# Patient Record
Sex: Female | Born: 1965 | Race: Black or African American | Hispanic: No | Marital: Single | Smoking: Never smoker
Health system: Southern US, Community
[De-identification: ages and names within clinical notes are randomized; demographics above are authoritative.]

## PROBLEM LIST (undated history)

## (undated) DIAGNOSIS — F32A Depression, unspecified: Secondary | ICD-10-CM

## (undated) DIAGNOSIS — Z9989 Dependence on other enabling machines and devices: Secondary | ICD-10-CM

## (undated) DIAGNOSIS — F419 Anxiety disorder, unspecified: Secondary | ICD-10-CM

## (undated) DIAGNOSIS — D649 Anemia, unspecified: Secondary | ICD-10-CM

## (undated) DIAGNOSIS — I10 Essential (primary) hypertension: Secondary | ICD-10-CM

## (undated) DIAGNOSIS — E785 Hyperlipidemia, unspecified: Secondary | ICD-10-CM

## (undated) DIAGNOSIS — M1990 Unspecified osteoarthritis, unspecified site: Secondary | ICD-10-CM

## (undated) DIAGNOSIS — T7840XA Allergy, unspecified, initial encounter: Secondary | ICD-10-CM

## (undated) DIAGNOSIS — F4310 Post-traumatic stress disorder, unspecified: Secondary | ICD-10-CM

## (undated) DIAGNOSIS — O24419 Gestational diabetes mellitus in pregnancy, unspecified control: Secondary | ICD-10-CM

## (undated) DIAGNOSIS — R51 Headache: Secondary | ICD-10-CM

## (undated) DIAGNOSIS — R7303 Prediabetes: Secondary | ICD-10-CM

## (undated) DIAGNOSIS — J45909 Unspecified asthma, uncomplicated: Secondary | ICD-10-CM

## (undated) DIAGNOSIS — L853 Xerosis cutis: Secondary | ICD-10-CM

## (undated) DIAGNOSIS — Z87442 Personal history of urinary calculi: Secondary | ICD-10-CM

## (undated) DIAGNOSIS — G4730 Sleep apnea, unspecified: Secondary | ICD-10-CM

## (undated) DIAGNOSIS — J302 Other seasonal allergic rhinitis: Secondary | ICD-10-CM

## (undated) HISTORY — PX: WISDOM TOOTH EXTRACTION: SHX21

## (undated) HISTORY — DX: Allergy, unspecified, initial encounter: T78.40XA

## (undated) HISTORY — PX: OTHER SURGICAL HISTORY: SHX169

## (undated) HISTORY — DX: Depression, unspecified: F32.A

## (undated) HISTORY — PX: MYOMECTOMY ABDOMINAL APPROACH: SUR870

## (undated) HISTORY — PX: COLONOSCOPY: SHX174

---

## 1998-09-20 ENCOUNTER — Other Ambulatory Visit: Admission: RE | Admit: 1998-09-20 | Discharge: 1998-09-20 | Payer: Self-pay | Admitting: Family Medicine

## 1998-10-11 ENCOUNTER — Emergency Department (HOSPITAL_COMMUNITY): Admission: EM | Admit: 1998-10-11 | Discharge: 1998-10-11 | Payer: Self-pay | Admitting: Emergency Medicine

## 2001-04-28 ENCOUNTER — Other Ambulatory Visit: Admission: RE | Admit: 2001-04-28 | Discharge: 2001-04-28 | Payer: Self-pay | Admitting: Obstetrics and Gynecology

## 2001-11-20 ENCOUNTER — Encounter: Payer: Self-pay | Admitting: Obstetrics and Gynecology

## 2001-11-20 ENCOUNTER — Ambulatory Visit (HOSPITAL_COMMUNITY): Admission: RE | Admit: 2001-11-20 | Discharge: 2001-11-20 | Payer: Self-pay | Admitting: Obstetrics and Gynecology

## 2002-01-08 ENCOUNTER — Inpatient Hospital Stay (HOSPITAL_COMMUNITY): Admission: RE | Admit: 2002-01-08 | Discharge: 2002-01-10 | Payer: Self-pay | Admitting: Obstetrics and Gynecology

## 2002-04-09 ENCOUNTER — Other Ambulatory Visit: Admission: RE | Admit: 2002-04-09 | Discharge: 2002-04-09 | Payer: Self-pay | Admitting: Obstetrics and Gynecology

## 2002-12-07 ENCOUNTER — Other Ambulatory Visit: Admission: RE | Admit: 2002-12-07 | Discharge: 2002-12-07 | Payer: Self-pay | Admitting: Obstetrics and Gynecology

## 2003-12-13 ENCOUNTER — Other Ambulatory Visit: Admission: RE | Admit: 2003-12-13 | Discharge: 2003-12-13 | Payer: Self-pay | Admitting: Obstetrics and Gynecology

## 2004-07-18 ENCOUNTER — Other Ambulatory Visit: Admission: RE | Admit: 2004-07-18 | Discharge: 2004-07-18 | Payer: Self-pay | Admitting: Obstetrics and Gynecology

## 2011-08-17 ENCOUNTER — Other Ambulatory Visit: Payer: Self-pay | Admitting: Obstetrics and Gynecology

## 2011-08-17 DIAGNOSIS — R928 Other abnormal and inconclusive findings on diagnostic imaging of breast: Secondary | ICD-10-CM

## 2011-08-29 ENCOUNTER — Ambulatory Visit
Admission: RE | Admit: 2011-08-29 | Discharge: 2011-08-29 | Disposition: A | Discharge status: Home / Self Care | Payer: No Typology Code available for payment source | Source: Ambulatory Visit | Attending: Obstetrics and Gynecology | Admitting: Obstetrics and Gynecology

## 2011-08-29 DIAGNOSIS — R928 Other abnormal and inconclusive findings on diagnostic imaging of breast: Secondary | ICD-10-CM

## 2011-09-03 ENCOUNTER — Ambulatory Visit
Admission: RE | Admit: 2011-09-03 | Discharge: 2011-09-03 | Disposition: A | Discharge status: Home / Self Care | Payer: No Typology Code available for payment source | Source: Ambulatory Visit | Attending: Obstetrics and Gynecology | Admitting: Obstetrics and Gynecology

## 2011-09-03 DIAGNOSIS — R928 Other abnormal and inconclusive findings on diagnostic imaging of breast: Secondary | ICD-10-CM

## 2013-01-19 ENCOUNTER — Encounter (HOSPITAL_COMMUNITY): Payer: Self-pay | Admitting: Pharmacist

## 2013-02-02 ENCOUNTER — Encounter (HOSPITAL_COMMUNITY): Payer: Self-pay

## 2013-02-02 ENCOUNTER — Encounter (HOSPITAL_COMMUNITY)
Admission: RE | Admit: 2013-02-02 | Discharge: 2013-02-02 | Disposition: A | Discharge status: Home / Self Care | Payer: No Typology Code available for payment source | Source: Ambulatory Visit | Attending: Obstetrics and Gynecology | Admitting: Obstetrics and Gynecology

## 2013-02-02 DIAGNOSIS — Z01812 Encounter for preprocedural laboratory examination: Secondary | ICD-10-CM | POA: Insufficient documentation

## 2013-02-02 HISTORY — DX: Hyperlipidemia, unspecified: E78.5

## 2013-02-02 HISTORY — DX: Anxiety disorder, unspecified: F41.9

## 2013-02-02 HISTORY — DX: Headache: R51

## 2013-02-02 HISTORY — DX: Xerosis cutis: L85.3

## 2013-02-02 HISTORY — DX: Unspecified osteoarthritis, unspecified site: M19.90

## 2013-02-02 HISTORY — DX: Other seasonal allergic rhinitis: J30.2

## 2013-02-02 HISTORY — DX: Anemia, unspecified: D64.9

## 2013-02-02 HISTORY — DX: Sleep apnea, unspecified: G47.30

## 2013-02-02 HISTORY — DX: Personal history of urinary calculi: Z87.442

## 2013-02-02 LAB — CBC
MCV: 85.3 fL (ref 78.0–100.0)
Platelets: 284 10*3/uL (ref 150–400)
RBC: 4.56 MIL/uL (ref 3.87–5.11)
WBC: 5.6 10*3/uL (ref 4.0–10.5)

## 2013-02-02 NOTE — Patient Instructions (Addendum)
   Your procedure is scheduled on:  Monday, 11/17  Enter through the Main Entrance of [facility] at: 6 AM Pick up the phone at the desk and dial [phone] and inform us of your arrival.  Please call this number if you have any problems the morning of surgery: [phone]  Remember: Do not eat or drink after midnight: Sunday Take these medicines the morning of surgery with a SIP OF WATER:  Lipitor, zyrtec  Do not wear jewelry, make-up, or FINGER nail polish No metal in your hair or on your body. Do not wear lotions, powders, perfumes. You may wear deodorant.  Please use your CHG wash as directed prior to surgery.  Do not shave anywhere for at least 12 hours prior to first CHG shower.  Do not bring valuables to the hospital. Contacts, dentures or bridgework may not be worn into surgery.  Leave suitcase in the car. After Surgery it may be brought to your room. For patients being admitted to the hospital, checkout time is 11:00am the day of discharge.  Home with daughter Elberta Fortis  cell [phone] or cousin Jamika Sadek cell [phone].

## 2013-02-04 NOTE — H&P (Signed)
  Patient name  Kristen Heath, Kristen Heath DICTATION#  [id number] CSN# [id number]  Juluis Mire, MD 02/04/2013 2:00 PM

## 2013-02-06 NOTE — H&P (Signed)
NAMEMarland Heath  TAM, SAVOIA NO.:  [id number]  MEDICAL RECORD NO.:  [id number]  LOCATION:  PERIO                         FACILITY:  [facility]  PHYSICIAN:  Juluis Mire, M.D.   DATE OF BIRTH:  [birth year]  DATE OF ADMISSION:  11/18/2012 DATE OF DISCHARGE:                             HISTORY & PHYSICAL   The date of her surgery is January 30, 2013, at [facility] here in [place].  HISTORY OF PRESENT ILLNESS:  The patient is a 47 year old, gravida 3, para 1, single female presents for LAVH and removal of both fallopian tubes.  She has had a previous myomectomy in 2003.  She was having problems with increasing pelvic pain and abnormal uterine bleeding felt to be secondary to adenomyosis and/or endometriosis.  This has been unresponsive to conservative management.  The patient now presents for definitive surgery perform with laparoscopic-assisted vaginal hysterectomy.  She describes her cycles the first day is being extremely heavy changing pads every 35-40 minutes with clots and worsening pelvic pain and discomfort.  Her hemoglobin has been depressed at 10.1.  Saline infusion and ultrasound basically consistent with adenomyosis.  ALLERGIES:  No known drug allergies.  MEDICATIONS:  She is on Mobic 7.5 mg, atorvastatin, calcium 40 mg a day, Celexa 10 mg a day, prenatal vitamins, sumatriptan 50 mg and iron sulfate supplementation.  PAST MEDICAL HISTORY:  She has had a history of glucose intolerance, arthritis and has had previous anemias and hypertension.  PAST SURGICAL HISTORY:  She had knee surgery in 1999, in 1993 she had a C-section.  She had a myomectomy in 2003.  SOCIAL HISTORY:  No drug, no cigarette, no tobacco or alcohol use.  FAMILY HISTORY:  She had a half sister with a history of breast cancer.  REVIEW OF SYSTEMS:  Noncontributory.  PHYSICAL EXAMINATION:  VITAL SIGNS:  The patient is afebrile with stable vital signs. HEENT:  The patient was  normocephalic. Pupils were equal, round and reactive to light and accommodation.  Extraocular movements were intact. Sclerae and conjunctivae were clear.  Oropharynx clear. NECK:  Without thyromegaly. BREASTS:  No discrete masses. LUNGS:  Clear. CARDIOVASCULAR SYSTEM:  Regular rhythm and rate.  No murmurs or gallops. No carotid or abdominal bruits. ABDOMEN:  Benign.  Well-healed low-transverse incision. PELVIC:  Normal external genitalia.  Vaginal mucosa is clear.  Cervix unremarkable.  Uterus feels to be of normal shape and size.  Adnexa unremarkable. EXTREMITIES:  Trace edema. NEUROLOGIC:  Grossly within normal limits.  IMPRESSION: 1. Menorrhagia and pelvic pain secondary to adenomyosis. 2. Previous myomectomy. 3. Hyperlipidemia.  PLAN:  The patient will undergo attempted laparoscopic-assisted vaginal hysterectomy with removal of both fallopian tubes.  The potential risks of surgery have been discussed including the risk of infection.  The risk of hemorrhage could require transfusion with the risk of AIDS or hepatitis.  Risk of injury to adjacent organs including bladder, bowel, ureters that could require further exploratory surgery.  Risk of deep venous thrombosis and pulmonary embolus.  Leaving the ovaries does have the potential for malignant transformation.  This may be because somewhat by removing the fallopian tubes.  Alternatives have been discussed including Mirena IUD or ablated technique.  She is in favor  of definitive therapy and informed hysterectomy for which she is admitted at the present time.     Juluis Mire, M.D.     JSM/MEDQ  D:  02/04/2013  T:  02/05/2013  Job:  [id number]

## 2013-02-09 ENCOUNTER — Inpatient Hospital Stay (HOSPITAL_COMMUNITY)
Admission: RE | Admit: 2013-02-09 | Discharge: 2013-02-11 | DRG: 743 | Disposition: A | Discharge status: Home / Self Care | Payer: No Typology Code available for payment source | Source: Ambulatory Visit | Attending: Obstetrics and Gynecology | Admitting: Obstetrics and Gynecology

## 2013-02-09 ENCOUNTER — Encounter (HOSPITAL_COMMUNITY)
Admission: RE | Disposition: A | Discharge status: Home / Self Care | Payer: Self-pay | Source: Ambulatory Visit | Attending: Obstetrics and Gynecology

## 2013-02-09 ENCOUNTER — Ambulatory Visit (HOSPITAL_COMMUNITY): Payer: No Typology Code available for payment source | Admitting: Anesthesiology

## 2013-02-09 ENCOUNTER — Encounter (HOSPITAL_COMMUNITY): Payer: No Typology Code available for payment source | Admitting: Anesthesiology

## 2013-02-09 ENCOUNTER — Encounter (HOSPITAL_COMMUNITY): Payer: Self-pay | Admitting: *Deleted

## 2013-02-09 DIAGNOSIS — N736 Female pelvic peritoneal adhesions (postinfective): Secondary | ICD-10-CM | POA: Diagnosis present

## 2013-02-09 DIAGNOSIS — N8000 Endometriosis of the uterus, unspecified: Secondary | ICD-10-CM | POA: Diagnosis present

## 2013-02-09 DIAGNOSIS — N938 Other specified abnormal uterine and vaginal bleeding: Secondary | ICD-10-CM | POA: Diagnosis present

## 2013-02-09 DIAGNOSIS — N939 Abnormal uterine and vaginal bleeding, unspecified: Secondary | ICD-10-CM | POA: Diagnosis present

## 2013-02-09 DIAGNOSIS — N920 Excessive and frequent menstruation with regular cycle: Secondary | ICD-10-CM | POA: Diagnosis present

## 2013-02-09 DIAGNOSIS — N949 Unspecified condition associated with female genital organs and menstrual cycle: Principal | ICD-10-CM | POA: Diagnosis present

## 2013-02-09 DIAGNOSIS — Z90710 Acquired absence of both cervix and uterus: Secondary | ICD-10-CM | POA: Clinically undetermined

## 2013-02-09 DIAGNOSIS — Z5331 Laparoscopic surgical procedure converted to open procedure: Secondary | ICD-10-CM

## 2013-02-09 DIAGNOSIS — R102 Pelvic and perineal pain unspecified side: Secondary | ICD-10-CM | POA: Diagnosis present

## 2013-02-09 DIAGNOSIS — E785 Hyperlipidemia, unspecified: Secondary | ICD-10-CM | POA: Diagnosis present

## 2013-02-09 DIAGNOSIS — D251 Intramural leiomyoma of uterus: Secondary | ICD-10-CM | POA: Diagnosis present

## 2013-02-09 DIAGNOSIS — D252 Subserosal leiomyoma of uterus: Secondary | ICD-10-CM | POA: Diagnosis present

## 2013-02-09 DIAGNOSIS — D250 Submucous leiomyoma of uterus: Secondary | ICD-10-CM | POA: Diagnosis present

## 2013-02-09 HISTORY — PX: ABDOMINAL HYSTERECTOMY: SHX81

## 2013-02-09 HISTORY — PX: UNILATERAL SALPINGECTOMY: SHX6160

## 2013-02-09 LAB — HCG, SERUM, QUALITATIVE: Preg, Serum: NEGATIVE

## 2013-02-09 SURGERY — HYSTERECTOMY, ABDOMINAL
Anesthesia: General | Site: Abdomen | Laterality: Right | Wound class: Clean Contaminated

## 2013-02-09 MED ORDER — CEFAZOLIN SODIUM-DEXTROSE 2-3 GM-% IV SOLR
2.0000 g | INTRAVENOUS | Status: AC
  Administered 2013-02-09: 2 g via INTRAVENOUS

## 2013-02-09 MED ORDER — BUPIVACAINE HCL (PF) 0.25 % IJ SOLN
INTRAMUSCULAR | Status: AC
  Filled 2013-02-09: qty 30

## 2013-02-09 MED ORDER — LIDOCAINE HCL (CARDIAC) 20 MG/ML IV SOLN
INTRAVENOUS | Status: AC
  Filled 2013-02-09: qty 5

## 2013-02-09 MED ORDER — FENTANYL CITRATE 0.05 MG/ML IJ SOLN
INTRAMUSCULAR | Status: AC
  Filled 2013-02-09: qty 5

## 2013-02-09 MED ORDER — ROCURONIUM BROMIDE 100 MG/10ML IV SOLN
INTRAVENOUS | Status: AC
  Filled 2013-02-09: qty 1

## 2013-02-09 MED ORDER — CEFAZOLIN SODIUM-DEXTROSE 2-3 GM-% IV SOLR: 2.0000 g | INTRAVENOUS | Status: DC

## 2013-02-09 MED ORDER — ZOLPIDEM TARTRATE 5 MG PO TABS: 5.0000 mg | ORAL_TABLET | Freq: Every evening | ORAL | Status: DC | PRN

## 2013-02-09 MED ORDER — ACETAMINOPHEN 325 MG PO TABS
650.0000 mg | ORAL_TABLET | ORAL | Status: DC | PRN
  Administered 2013-02-09: 650 mg via ORAL
  Filled 2013-02-09: qty 2

## 2013-02-09 MED ORDER — GLYCOPYRROLATE 0.2 MG/ML IJ SOLN
INTRAMUSCULAR | Status: DC | PRN
  Administered 2013-02-09: 0.2 mg via INTRAVENOUS
  Administered 2013-02-09: 0.1 mg via INTRAVENOUS
  Administered 2013-02-09: .8 mg via INTRAVENOUS

## 2013-02-09 MED ORDER — HYDROMORPHONE 0.3 MG/ML IV SOLN
INTRAVENOUS | Status: DC
Start: 2013-02-09 — End: 2013-02-10
  Administered 2013-02-09: 0.2 mL via INTRAVENOUS
  Administered 2013-02-09: 3.3 mL via INTRAVENOUS
  Administered 2013-02-09: 1.39 mg via INTRAVENOUS
  Administered 2013-02-09: 1.33 mL via INTRAVENOUS
  Administered 2013-02-10: 0.6 mg via INTRAVENOUS
  Administered 2013-02-10: 3 mL via INTRAVENOUS
  Administered 2013-02-10: 0.599 mg via INTRAVENOUS
  Filled 2013-02-09: qty 25

## 2013-02-09 MED ORDER — PHENYLEPHRINE 40 MCG/ML (10ML) SYRINGE FOR IV PUSH (FOR BLOOD PRESSURE SUPPORT)
PREFILLED_SYRINGE | INTRAVENOUS | Status: AC
  Filled 2013-02-09: qty 5

## 2013-02-09 MED ORDER — PROPOFOL 10 MG/ML IV EMUL
INTRAVENOUS | Status: AC
  Filled 2013-02-09: qty 20

## 2013-02-09 MED ORDER — NEOSTIGMINE METHYLSULFATE 1 MG/ML IJ SOLN
INTRAMUSCULAR | Status: AC
  Filled 2013-02-09: qty 1

## 2013-02-09 MED ORDER — LACTATED RINGERS IV SOLN
INTRAVENOUS | Status: DC
  Administered 2013-02-09 (×2): via INTRAVENOUS

## 2013-02-09 MED ORDER — LIDOCAINE HCL (CARDIAC) 20 MG/ML IV SOLN
INTRAVENOUS | Status: DC | PRN
  Administered 2013-02-09: 80 mg via INTRAVENOUS

## 2013-02-09 MED ORDER — PROMETHAZINE HCL 25 MG/ML IJ SOLN: 6.2500 mg | INTRAMUSCULAR | Status: DC | PRN

## 2013-02-09 MED ORDER — MIDAZOLAM HCL 2 MG/2ML IJ SOLN
INTRAMUSCULAR | Status: AC
  Filled 2013-02-09: qty 2

## 2013-02-09 MED ORDER — DIPHENHYDRAMINE HCL 50 MG/ML IJ SOLN: 12.5000 mg | Freq: Four times a day (QID) | INTRAMUSCULAR | Status: DC | PRN

## 2013-02-09 MED ORDER — FENTANYL CITRATE 0.05 MG/ML IJ SOLN
INTRAMUSCULAR | Status: DC | PRN
  Administered 2013-02-09 (×3): 50 ug via INTRAVENOUS

## 2013-02-09 MED ORDER — BUPIVACAINE HCL (PF) 0.25 % IJ SOLN
INTRAMUSCULAR | Status: DC | PRN
  Administered 2013-02-09: 3 mL

## 2013-02-09 MED ORDER — ONDANSETRON HCL 4 MG PO TABS: 4.0000 mg | ORAL_TABLET | Freq: Four times a day (QID) | ORAL | Status: DC | PRN

## 2013-02-09 MED ORDER — MENTHOL 3 MG MT LOZG: 1.0000 | LOZENGE | OROMUCOSAL | Status: DC | PRN

## 2013-02-09 MED ORDER — GLYCOPYRROLATE 0.2 MG/ML IJ SOLN
INTRAMUSCULAR | Status: AC
  Filled 2013-02-09: qty 3

## 2013-02-09 MED ORDER — DEXAMETHASONE SODIUM PHOSPHATE 10 MG/ML IJ SOLN
INTRAMUSCULAR | Status: DC | PRN
  Administered 2013-02-09: 10 mg via INTRAVENOUS

## 2013-02-09 MED ORDER — PHENYLEPHRINE HCL 10 MG/ML IJ SOLN
INTRAMUSCULAR | Status: DC | PRN
  Administered 2013-02-09: 40 ug via INTRAVENOUS
  Administered 2013-02-09: 80 ug via INTRAVENOUS
  Administered 2013-02-09 (×3): 40 ug via INTRAVENOUS

## 2013-02-09 MED ORDER — ONDANSETRON HCL 4 MG/2ML IJ SOLN
INTRAMUSCULAR | Status: AC
  Filled 2013-02-09: qty 2

## 2013-02-09 MED ORDER — PROPOFOL 10 MG/ML IV BOLUS
INTRAVENOUS | Status: DC | PRN
  Administered 2013-02-09: 200 mg via INTRAVENOUS

## 2013-02-09 MED ORDER — NALOXONE HCL 0.4 MG/ML IJ SOLN: 0.4000 mg | INTRAMUSCULAR | Status: DC | PRN

## 2013-02-09 MED ORDER — ROCURONIUM BROMIDE 100 MG/10ML IV SOLN
INTRAVENOUS | Status: DC | PRN
  Administered 2013-02-09: 40 mg via INTRAVENOUS
  Administered 2013-02-09: 10 mg via INTRAVENOUS

## 2013-02-09 MED ORDER — GLYCOPYRROLATE 0.2 MG/ML IJ SOLN
INTRAMUSCULAR | Status: AC
  Filled 2013-02-09: qty 2

## 2013-02-09 MED ORDER — KETOROLAC TROMETHAMINE 30 MG/ML IJ SOLN
INTRAMUSCULAR | Status: DC | PRN
  Administered 2013-02-09: 30 mg via INTRAVENOUS

## 2013-02-09 MED ORDER — DEXAMETHASONE SODIUM PHOSPHATE 10 MG/ML IJ SOLN
INTRAMUSCULAR | Status: AC
  Filled 2013-02-09: qty 1

## 2013-02-09 MED ORDER — HYDROMORPHONE HCL PF 1 MG/ML IJ SOLN
0.2500 mg | INTRAMUSCULAR | Status: DC | PRN
  Administered 2013-02-09 (×4): 0.5 mg via INTRAVENOUS

## 2013-02-09 MED ORDER — MEPERIDINE HCL 25 MG/ML IJ SOLN: 6.2500 mg | INTRAMUSCULAR | Status: DC | PRN

## 2013-02-09 MED ORDER — SODIUM CHLORIDE 0.9 % IJ SOLN: 9.0000 mL | INTRAMUSCULAR | Status: DC | PRN

## 2013-02-09 MED ORDER — HYDROMORPHONE HCL PF 1 MG/ML IJ SOLN
INTRAMUSCULAR | Status: AC
  Filled 2013-02-09: qty 1

## 2013-02-09 MED ORDER — MIDAZOLAM HCL 2 MG/2ML IJ SOLN
INTRAMUSCULAR | Status: DC | PRN
  Administered 2013-02-09: 2 mg via INTRAVENOUS

## 2013-02-09 MED ORDER — NEOSTIGMINE METHYLSULFATE 1 MG/ML IJ SOLN
INTRAMUSCULAR | Status: DC | PRN
  Administered 2013-02-09: 4 mg via INTRAVENOUS

## 2013-02-09 MED ORDER — KETOROLAC TROMETHAMINE 30 MG/ML IJ SOLN
INTRAMUSCULAR | Status: AC
  Filled 2013-02-09: qty 1

## 2013-02-09 MED ORDER — LACTATED RINGERS IV SOLN: INTRAVENOUS | Status: DC

## 2013-02-09 MED ORDER — ONDANSETRON HCL 4 MG/2ML IJ SOLN: 4.0000 mg | Freq: Four times a day (QID) | INTRAMUSCULAR | Status: DC | PRN

## 2013-02-09 MED ORDER — ONDANSETRON HCL 4 MG/2ML IJ SOLN
INTRAMUSCULAR | Status: DC | PRN
  Administered 2013-02-09: 4 mg via INTRAVENOUS

## 2013-02-09 MED ORDER — DIPHENHYDRAMINE HCL 12.5 MG/5ML PO ELIX: 12.5000 mg | ORAL_SOLUTION | Freq: Four times a day (QID) | ORAL | Status: DC | PRN

## 2013-02-09 MED ORDER — OXYCODONE-ACETAMINOPHEN 5-325 MG PO TABS
1.0000 | ORAL_TABLET | ORAL | Status: DC | PRN
  Filled 2013-02-09 (×4): qty 2

## 2013-02-09 MED ORDER — LACTATED RINGERS IV SOLN
INTRAVENOUS | Status: DC
  Administered 2013-02-09 (×2): 125 mL/h via INTRAVENOUS
  Administered 2013-02-10: 02:00:00 via INTRAVENOUS

## 2013-02-09 SURGICAL SUPPLY — 40 items
ADH SKN CLS APL DERMABOND .7 (GAUZE/BANDAGES/DRESSINGS) ×3
BLADE SURG 10 STRL SS (BLADE) ×2 IMPLANT
CATH ROBINSON RED A/P 16FR (CATHETERS) ×4 IMPLANT
CLOTH BEACON ORANGE TIMEOUT ST (SAFETY) ×4 IMPLANT
CONT PATH 16OZ SNAP LID 3702 (MISCELLANEOUS) ×4 IMPLANT
COVER MAYO STAND STRL (DRAPES) ×2 IMPLANT
COVER TABLE BACK 60X90 (DRAPES) ×4 IMPLANT
DERMABOND ADVANCED (GAUZE/BANDAGES/DRESSINGS) ×1
DERMABOND ADVANCED .7 DNX12 (GAUZE/BANDAGES/DRESSINGS) ×3 IMPLANT
DRESSING TELFA 8X3 (GAUZE/BANDAGES/DRESSINGS) ×2 IMPLANT
DRSG OPSITE POSTOP 4X10 (GAUZE/BANDAGES/DRESSINGS) ×2 IMPLANT
ELECT REM PT RETURN 9FT ADLT (ELECTROSURGICAL) ×4
ELECTRODE REM PT RTRN 9FT ADLT (ELECTROSURGICAL) ×1 IMPLANT
GAUZE SPONGE 4X4 12PLY STRL LF (GAUZE/BANDAGES/DRESSINGS) ×2 IMPLANT
GLOVE BIO SURGEON STRL SZ7 (GLOVE) ×8 IMPLANT
GLOVE BIOGEL PI IND STRL 6.5 (GLOVE) ×3 IMPLANT
GLOVE BIOGEL PI INDICATOR 6.5 (GLOVE) ×1
GOWN STRL REIN XL XLG (GOWN DISPOSABLE) ×16 IMPLANT
NS IRRIG 1000ML POUR BTL (IV SOLUTION) ×4 IMPLANT
PACK LAVH (CUSTOM PROCEDURE TRAY) ×4 IMPLANT
PAD ABD 7.5X8 STRL (GAUZE/BANDAGES/DRESSINGS) ×2 IMPLANT
PROTECTOR NERVE ULNAR (MISCELLANEOUS) ×4 IMPLANT
SET IRRIG TUBING LAPAROSCOPIC (IRRIGATION / IRRIGATOR) ×2 IMPLANT
SPONGE LAP 18X18 X RAY DECT (DISPOSABLE) ×4 IMPLANT
SUT MON AB 2-0 CT1 27 (SUTURE) ×6 IMPLANT
SUT VIC AB 0 CT1 18XCR BRD8 (SUTURE) ×7 IMPLANT
SUT VIC AB 0 CT1 27 (SUTURE) ×4
SUT VIC AB 0 CT1 27XBRD ANBCTR (SUTURE) ×3 IMPLANT
SUT VIC AB 0 CT1 36 (SUTURE) ×4 IMPLANT
SUT VIC AB 0 CT1 8-18 (SUTURE) ×12
SUT VICRYL 0 UR6 27IN ABS (SUTURE) ×2 IMPLANT
SUT VICRYL 1 TIES 12X18 (SUTURE) ×4 IMPLANT
SUT VICRYL 4-0 PS2 18IN ABS (SUTURE) ×4 IMPLANT
TAPE CLOTH SURG 4X10 WHT LF (GAUZE/BANDAGES/DRESSINGS) ×2 IMPLANT
TOWEL OR 17X24 6PK STRL BLUE (TOWEL DISPOSABLE) ×8 IMPLANT
TRAY FOLEY CATH 14FR (SET/KITS/TRAYS/PACK) ×4 IMPLANT
TROCAR BALLN 12MMX100 BLUNT (TROCAR) ×2 IMPLANT
TROCAR OPTI TIP 5M 100M (ENDOMECHANICALS) ×4 IMPLANT
WARMER LAPAROSCOPE (MISCELLANEOUS) ×4 IMPLANT
WATER STERILE IRR 1000ML POUR (IV SOLUTION) ×4 IMPLANT

## 2013-02-09 NOTE — H&P (Signed)
  History and physical exam unchanged 

## 2013-02-09 NOTE — Progress Notes (Signed)
Patient ID: Kristen Heath, female   DOB: [birth year], 47 y.o.   MRN: [id number] Af vss Dressing dry Minimal bleeding Good uo

## 2013-02-09 NOTE — Preoperative (Signed)
Beta Blockers   Reason not to administer Beta Blockers:Not Applicable 

## 2013-02-09 NOTE — Anesthesia Postprocedure Evaluation (Signed)
  Anesthesia Post-op Note  Anesthesia Post Note  Patient: Kristen Heath  Procedure(s) Performed: Procedure(s) (LRB): HYSTERECTOMY ABDOMINAL (N/A) UNILATERAL SALPINGECTOMY (Right)  Anesthesia type: General  Patient location: PACU  Post pain: Pain level controlled  Post assessment: Post-op Vital signs reviewed  Last Vitals:  Filed Vitals:   02/09/13 0920  BP: 115/48  Pulse:   Temp: 36.9 C  Resp: 24    Post vital signs: Reviewed  Level of consciousness: sedated  Complications: No apparent anesthesia complications

## 2013-02-09 NOTE — Op Note (Signed)
Patient name  Kristen Heath, Curvin DICTATION#  [id number] CSN# [id number]  [facility], MD 02/09/2013 9:16 AM

## 2013-02-09 NOTE — Brief Op Note (Signed)
02/09/2013  9:15 AM  PATIENT:  Kristen Heath  47 y.o. female  PRE-OPERATIVE DIAGNOSIS:  Adenomyosis  POST-OPERATIVE DIAGNOSIS:  Adenomyosis  PROCEDURE:  Procedure(s): HYSTERECTOMY ABDOMINAL (N/A) UNILATERAL SALPINGECTOMY (Right)  SURGEON:  Surgeon(s) and Role:    * Juluis Mire, MD - Primary    * Zelphia Cairo, MD - Assisting  PHYSICIAN ASSISTANT:   ASSISTANTS: atkins    ANESTHESIA:   local and general  EBL:  Total I/O In: 1000 [I.V.:1000] Out: 150 [Urine:100; Blood:50]  BLOOD ADMINISTERED:none  DRAINS: Urinary Catheter (Foley)   LOCAL MEDICATIONS USED:  MARCAINE     SPECIMEN:  Source of Specimen:  uterus and right fallopian tube  DISPOSITION OF SPECIMEN:  PATHOLOGY  COUNTS:  YES  TOURNIQUET:  * No tourniquets in log *  DICTATION: .Other Dictation: Dictation Number [id number]  PLAN OF CARE: Admit to inpatient   PATIENT DISPOSITION:  PACU - hemodynamically stable.   Delay start of Pharmacological VTE agent (>24hrs) due to surgical blood loss or risk of bleeding: no

## 2013-02-09 NOTE — Anesthesia Preprocedure Evaluation (Addendum)
Anesthesia Evaluation  Patient identified by MRN, date of birth, ID band Patient awake    Reviewed: Allergy & Precautions, H&P , NPO status , Patient's Chart, lab work & pertinent test results  Airway Mallampati: I TM Distance: >3 FB Neck ROM: Full    Dental no notable dental hx.    Pulmonary neg pulmonary ROS, sleep apnea ,  breath sounds clear to auscultation  Pulmonary exam normal       Cardiovascular negative cardio ROS  Rhythm:Regular Rate:Normal     Neuro/Psych negative neurological ROS  negative psych ROS   GI/Hepatic negative GI ROS, Neg liver ROS,   Endo/Other  negative endocrine ROSMorbid obesity  Renal/GU negative Renal ROS  negative genitourinary   Musculoskeletal negative musculoskeletal ROS (+)   Abdominal   Peds negative pediatric ROS (+)  Hematology negative hematology ROS (+)   Anesthesia Other Findings   Reproductive/Obstetrics negative OB ROS                           Anesthesia Physical Anesthesia Plan  ASA: II  Anesthesia Plan: General   Post-op Pain Management:    Induction: Intravenous  Airway Management Planned: Oral ETT  Additional Equipment:   Intra-op Plan:   Post-operative Plan: Extubation in OR  Informed Consent: I have reviewed the patients History and Physical, chart, labs and discussed the procedure including the risks, benefits and alternatives for the proposed anesthesia with the patient or authorized representative who has indicated his/her understanding and acceptance.   Dental advisory given  Plan Discussed with: CRNA  Anesthesia Plan Comments:         Anesthesia Quick Evaluation

## 2013-02-09 NOTE — Transfer of Care (Signed)
Immediate Anesthesia Transfer of Care Note  Patient: Kristen Heath  Procedure(s) Performed: Procedure(s): HYSTERECTOMY ABDOMINAL (N/A) UNILATERAL SALPINGECTOMY (Right)  Patient Location: PACU  Anesthesia Type:General  Level of Consciousness: awake, alert  and oriented  Airway & Oxygen Therapy: Patient Spontanous Breathing and Patient connected to nasal cannula oxygen  Post-op Assessment: Report given to PACU RN, Post -op Vital signs reviewed and stable and Patient moving all extremities  Post vital signs: Reviewed and stable  Complications: No apparent anesthesia complications

## 2013-02-10 ENCOUNTER — Encounter (HOSPITAL_COMMUNITY): Payer: Self-pay | Admitting: Obstetrics and Gynecology

## 2013-02-10 DIAGNOSIS — N939 Abnormal uterine and vaginal bleeding, unspecified: Secondary | ICD-10-CM | POA: Diagnosis present

## 2013-02-10 DIAGNOSIS — R102 Pelvic and perineal pain: Secondary | ICD-10-CM | POA: Diagnosis present

## 2013-02-10 DIAGNOSIS — Z90710 Acquired absence of both cervix and uterus: Secondary | ICD-10-CM | POA: Clinically undetermined

## 2013-02-10 LAB — CBC
Hemoglobin: 11.3 g/dL — ABNORMAL LOW (ref 12.0–15.0)
MCH: 28.4 pg (ref 26.0–34.0)
MCHC: 33.6 g/dL (ref 30.0–36.0)
Platelets: 240 10*3/uL (ref 150–400)
RBC: 3.98 MIL/uL (ref 3.87–5.11)
RDW: 13.9 % (ref 11.5–15.5)

## 2013-02-10 MED ORDER — OXYCODONE-ACETAMINOPHEN 5-325 MG PO TABS
1.0000 | ORAL_TABLET | ORAL | Status: DC | PRN
  Administered 2013-02-10 – 2013-02-11 (×4): 2 via ORAL

## 2013-02-10 MED ORDER — HYDROMORPHONE HCL PF 1 MG/ML IJ SOLN
INTRAMUSCULAR | Status: AC
  Filled 2013-02-10: qty 1

## 2013-02-10 NOTE — Progress Notes (Signed)
1 Day Post-Op Procedure(s) (LRB): HYSTERECTOMY ABDOMINAL (N/A) UNILATERAL SALPINGECTOMY (Right)  Subjective: Patient reports incisional pain and tolerating PO.    Objective: I have reviewed patient's vital signs and labs.  General: alert GI: soft, non-tender; bowel sounds normal; no masses,  no organomegaly and incision: clean Vaginal Bleeding: none  Assessment: s/p Procedure(s): HYSTERECTOMY ABDOMINAL (N/A) UNILATERAL SALPINGECTOMY (Right): stable  Plan: Advance diet  LOS: 1 day    Kristen Heath S 02/10/2013, 8:31 AM

## 2013-02-10 NOTE — Op Note (Signed)
NAMEICELYNN, ONKEN NO.:  [id number]  MEDICAL RECORD NO.:  [id number]  LOCATION:  9319                          FACILITY:  [facility]  PHYSICIAN:  Juluis Mire, M.D.   DATE OF BIRTH:  04-08-1965  DATE OF PROCEDURE:  02/09/2013 DATE OF DISCHARGE:                              OPERATIVE REPORT   PREOPERATIVE DIAGNOSIS:  Pelvic pain and abnormal uterine bleeding secondary to adenomyosis.  POSTOPERATIVE DIAGNOSIS:  Pelvic pain and abnormal uterine bleeding secondary to adenomyosis with the addition of extensive pelvic adhesions.  OPERATIVE PROCEDURE:  An attempt at open laparoscopy followed by exploratory laparotomy with abdominal hysterectomy and removal of right fallopian tube.  Lysis of adhesions.  SURGEON:  Juluis Mire, MD  ASSISTANT:  Zelphia Cairo, MD  ESTIMATED BLOOD LOSS:  300 mL.  PACKS:  None.  DRAINS:  Urethral Foley.  INTRAOPERATIVE BLOOD PLACEMENT:  None.  COMPLICATIONS:  None.  INDICATION:  Dictated in history and physical.  PROCEDURE IN DETAIL:  The patient was taken to the OR and placed in supine position.  After satisfactory level of general endotracheal anesthesia obtained, the patient was placed in the dorsal lithotomy position using the Allen stirrups.  The abdomen, perineum, and vagina were prepped out with Betadine.  Bladder was emptied by in-and-out catheterization.  A Hulka tenaculum was put in place and secured.  The patient was draped in sterile field.  A subumbilical incision was made with a knife and carried through subcutaneous tissue.  We identified the fascia and entered it sharply.  However, we had a very difficult time getting through the muscles and peritoneum.  It was noted at this point in time due to her panniculus that the infraumbilical incision was actually sitting lateral to the sacral promontory.  Due to poor positioning, the decision was to do exploratory surgery.  The patient's legs were repositioned.  A  Foley was placed to straight drain.  The previous low transverse incision was identified.  Incision was made in this area and was carried through the subcutaneous tissue.  We identified at this point in time our hole in the fascia, it was indeed with very low on the abdomen.  We extended the fascial incision laterally on both sides.  Muscles were separated in the midline. Perineum was entered sharply.  Incision of perineum extended both superiorly and inferiorly.  At this point in time, it was noted that the uterus was densely adherent to the anterior abdominal wall.  We identified the right tube and ovary.  The left tube and ovary somewhat adherent to the pelvic side wall.  Using the scissors, we were able to free the adhesions of the uterus to the anterior abdominal wall.  At this point in time, the uterus was relatively free.  The left ovary had torn free from the uterus.  We clamped it and used a suture ligature of 0 Vicryl to bring about hemostasis.  At this point in time, we turned to the right side.  The right round ligament was clamped, cut, and suture ligated with 0 Vicryl.  The utero-ovarian pedicle was isolated.  It was clamped and cut.  It was secured with a free tie of 0 Vicryl and  a suture ligature of 0 Vicryl.  At this point in time, we developed the bladder flap.  The right uterine vessels were clamped, cut and suture ligated with 0 Vicryl.  We then went to the left side.  We developed that side, could identify the round ligament.  At this point in time, the left uterine vessels were clamped, cut and suture ligated with 0 Vicryl.  We further developed the bladder flap.  Using clamp cut and tie technique with suture ligature of 0 Vicryl, the parametrium was serially separated from the side of the uterus.  Vaginal angles were identified, clamped and cut.  The intervening vaginal mucosa was excised.  Uterus passed off the operative field.  The angles were secured with  suture ligature of 0 Vicryl.  Intervening vaginal mucosa was closed with two figure-of-eights of 0 Vicryl.  We then went to the left ovary.  It was adherent to the pelvic side wall, and we really could not identify fallopian tube on that side.  She had some areas of bleeding.  We used a right angle and secured that with suture ligature of 0 Vicryl.  At this point in time, the left ovary was hemostatically intact.  It was well out of the pelvis leaning to the right side.  We could identify the right fallopian tube.  Kelly clamp was used to clamp it off.  It was excised and passed off the operative field.  Suture ligature of 0 Vicryl brought about hemostasis.  At this point in time, we irrigated the pelvis, had good hemostasis in both ovaries and at the vaginal cuff. All packs were removed.  One additional Lenox Ahr retractor had been put in place, it was removed also.  At this point in time, muscles were approximated with interrupted suture of 3-0 Vicryl.  Fascia was closed with running suture of 0 PDS.  The deep subcu was closed with interrupted sutures of 2-0 plain.  The skin was closed with staples. Subumbilical incision was closed with interrupted subcuticulars of 4-0 Vicryl.  Sponge, instrument and needle count was reported as correct by circulating nurse multiple times.  Urine output remained clear at the time of closure.  The patient was taken out of the dorsal lithotomy position.  Once alert and extubated, transferred to recovery room in good condition.     Juluis Mire, M.D.     JSM/MEDQ  D:  02/09/2013  T:  02/10/2013  Job:  [id number]

## 2013-02-10 NOTE — Anesthesia Postprocedure Evaluation (Signed)
  Anesthesia Post-op Note  Patient: Kristen Heath  Procedure(s) Performed: Procedure(s): HYSTERECTOMY ABDOMINAL (N/A) UNILATERAL SALPINGECTOMY (Right)  Patient Location: [facility]  Anesthesia Type:General  Level of Consciousness: awake, alert  and oriented  Airway and Oxygen Therapy: Patient Spontanous Breathing and Patient connected to nasal cannula oxygen  Post-op Pain: none  Post-op Assessment: Post-op Vital signs reviewed, Patient's Cardiovascular Status Stable, Respiratory Function Stable, No signs of Nausea or vomiting and Pain level controlled  Post-op Vital Signs: Reviewed and stable  Complications: No apparent anesthesia complications

## 2013-02-11 MED ORDER — OXYCODONE-ACETAMINOPHEN 7.5-325 MG PO TABS: 1.0000 | ORAL_TABLET | ORAL | Status: DC | PRN

## 2013-02-11 NOTE — Discharge Summary (Signed)
Kristen Heath, Kristen Heath         ACCOUNT NO.:  [id number]  MEDICAL RECORD NO.:  [id number]  LOCATION:  9319                          [facility]ACILITY:  [facility]  PHYSICIAN:  Juluis Mire, M.D.   DATE OF BIRTH:  1965-12-09  DATE OF ADMISSION:  02/09/2013 DATE OF DISCHARGE:  02/11/2013                              DISCHARGE SUMMARY   ADMITTING DIAGNOSES:  Pelvic pain, abnormal uterine bleeding secondary to adenomyosis.  POSTOPERATIVE DIAGNOSES:  Pelvic pain, abnormal uterine bleeding secondary to adenomyosis with addition of pelvic adhesions.  PROCEDURE:  Attempted open laparoscopy, subsequent exploratory laparotomy with total abdominal hysterectomy and removal of right fallopian tube.  For complete history and physical, please see dictated note.  COURSE IN THE HOSPITAL:  The patient underwent the above-noted surgery. We could get out her right fallopian tube.  The left tube and ovary are somewhat adherent to the pelvic sidewall.  They were tightly adherent to the side leaving a place but I could not get to the left fallopian tube. Her postop hemoglobin was 11.3.  She was discharged home on her second postop day.  At that time,  she was tolerating a regular diet.  She was ambulating without difficulty.  She was passing flatus and voiding without difficulty.  Her abdomen was soft and nontender.  Incision was intact.  In terms of complication none encountered during the stay in hospital. The patient discharged home in stable condition.  DISPOSITION:  The patient to avoid heavy lifting, vaginal entry, or driving a car.  She is to call with signs of infection, nausea, vomiting, active vaginal bleeding or excessive pain.  Instructed for signs and symptoms of deep venous thrombosis and pulmonary embolus. Discharged home on Percocet she needs for pain.  Office will call tomorrow and arrange follow up early next week for removal of staples.     Juluis Mire, M.D.     JSM/MEDQ  D:   02/11/2013  T:  02/11/2013  Job:  [id number]

## 2013-02-11 NOTE — Discharge Summary (Signed)
  Patient name Kristen Heath, Kristen Heath DICTATION#  [id number] CSN# [id number]  [facility], MD 02/11/2013 8:22 AM

## 2013-02-11 NOTE — Progress Notes (Signed)
Pt ambulated out   Teaching complete   Pt to return  For staple removal  In office

## 2013-02-11 NOTE — Progress Notes (Signed)
2 Days Post-Op Procedure(s) (LRB): HYSTERECTOMY ABDOMINAL (N/A) UNILATERAL SALPINGECTOMY (Right)  Subjective: Patient reports tolerating PO, + flatus and no problems voiding.    Objective: I have reviewed patient's vital signs.  General: cooperative GI: soft, non-tender; bowel sounds normal; no masses,  no organomegaly and incision: intact Vaginal Bleeding: minimal  Assessment: s/p Procedure(s): HYSTERECTOMY ABDOMINAL (N/A) UNILATERAL SALPINGECTOMY (Right): stable  Plan: Discharge home  LOS: 2 days    Dhamar Gregory S 02/11/2013, 8:21 AM

## 2014-01-27 ENCOUNTER — Other Ambulatory Visit: Payer: Self-pay | Admitting: Obstetrics and Gynecology

## 2014-01-28 LAB — CYTOLOGY - PAP

## 2014-03-28 ENCOUNTER — Emergency Department (HOSPITAL_BASED_OUTPATIENT_CLINIC_OR_DEPARTMENT_OTHER): Payer: No Typology Code available for payment source

## 2014-03-28 ENCOUNTER — Encounter (HOSPITAL_BASED_OUTPATIENT_CLINIC_OR_DEPARTMENT_OTHER): Payer: Self-pay

## 2014-03-28 ENCOUNTER — Emergency Department (HOSPITAL_BASED_OUTPATIENT_CLINIC_OR_DEPARTMENT_OTHER)
Admission: EM | Admit: 2014-03-28 | Discharge: 2014-03-28 | Disposition: A | Discharge status: Home / Self Care | Payer: No Typology Code available for payment source | Attending: Emergency Medicine | Admitting: Emergency Medicine

## 2014-03-28 DIAGNOSIS — D649 Anemia, unspecified: Secondary | ICD-10-CM | POA: Insufficient documentation

## 2014-03-28 DIAGNOSIS — M1990 Unspecified osteoarthritis, unspecified site: Secondary | ICD-10-CM | POA: Insufficient documentation

## 2014-03-28 DIAGNOSIS — R059 Cough, unspecified: Secondary | ICD-10-CM

## 2014-03-28 DIAGNOSIS — F419 Anxiety disorder, unspecified: Secondary | ICD-10-CM | POA: Insufficient documentation

## 2014-03-28 DIAGNOSIS — Z872 Personal history of diseases of the skin and subcutaneous tissue: Secondary | ICD-10-CM | POA: Insufficient documentation

## 2014-03-28 DIAGNOSIS — J302 Other seasonal allergic rhinitis: Secondary | ICD-10-CM | POA: Insufficient documentation

## 2014-03-28 DIAGNOSIS — R05 Cough: Secondary | ICD-10-CM | POA: Diagnosis present

## 2014-03-28 DIAGNOSIS — Z7951 Long term (current) use of inhaled steroids: Secondary | ICD-10-CM | POA: Diagnosis not present

## 2014-03-28 DIAGNOSIS — Z8669 Personal history of other diseases of the nervous system and sense organs: Secondary | ICD-10-CM | POA: Diagnosis not present

## 2014-03-28 DIAGNOSIS — Z791 Long term (current) use of non-steroidal anti-inflammatories (NSAID): Secondary | ICD-10-CM | POA: Insufficient documentation

## 2014-03-28 DIAGNOSIS — Z87442 Personal history of urinary calculi: Secondary | ICD-10-CM | POA: Insufficient documentation

## 2014-03-28 DIAGNOSIS — Z79899 Other long term (current) drug therapy: Secondary | ICD-10-CM | POA: Diagnosis not present

## 2014-03-28 DIAGNOSIS — E785 Hyperlipidemia, unspecified: Secondary | ICD-10-CM | POA: Diagnosis not present

## 2014-03-28 MED ORDER — BENZONATATE 100 MG PO CAPS: 100.0000 mg | ORAL_CAPSULE | Freq: Three times a day (TID) | ORAL | Status: DC

## 2014-03-28 MED ORDER — FLUTICASONE PROPIONATE 50 MCG/ACT NA SUSP: 2.0000 | Freq: Every day | NASAL | Status: AC

## 2014-03-28 MED ORDER — FEXOFENADINE HCL 60 MG PO TABS: 60.0000 mg | ORAL_TABLET | Freq: Two times a day (BID) | ORAL | Status: DC

## 2014-03-28 NOTE — ED Notes (Signed)
[facility] for [facility] evaluation of cough that she describes as productive for 6-8 weeks. denis fever for past few weeks, no distress. Non smoker

## 2014-03-28 NOTE — Discharge Instructions (Signed)
Allergic Rhinitis °Allergic rhinitis is when the mucous membranes in the nose respond to allergens. Allergens are particles in the air that cause your body to have an allergic reaction. This causes you to release allergic antibodies. Through a chain of events, these eventually cause you to release histamine into the blood stream. Although meant to protect the body, it is this release of histamine that causes your discomfort, such as frequent sneezing, congestion, and an itchy, runny nose.  °CAUSES  °Seasonal allergic rhinitis (hay fever) is caused by pollen allergens that may come from grasses, trees, and weeds. Year-round allergic rhinitis (perennial allergic rhinitis) is caused by allergens such as house dust mites, pet dander, and mold spores.  °SYMPTOMS  °· Nasal stuffiness (congestion). °· Itchy, runny nose with sneezing and tearing of the eyes. °DIAGNOSIS  °Your health care provider can help you determine the allergen or allergens that trigger your symptoms. If you and your health care provider are unable to determine the allergen, skin or blood testing may be used. °TREATMENT  °Allergic rhinitis does not have a cure, but it can be controlled by: °· Medicines and allergy shots (immunotherapy). °· Avoiding the allergen. °Hay fever may often be treated with antihistamines in pill or nasal spray forms. Antihistamines block the effects of histamine. There are over-the-counter medicines that may help with nasal congestion and swelling around the eyes. Check with your health care provider before taking or giving this medicine.  °If avoiding the allergen or the medicine prescribed do not work, there are many new medicines your health care provider can prescribe. Stronger medicine may be used if initial measures are ineffective. Desensitizing injections can be used if medicine and avoidance does not work. Desensitization is when a patient is given ongoing shots until the body becomes less sensitive to the allergen.  Make sure you follow up with your health care provider if problems continue. °HOME CARE INSTRUCTIONS °It is not possible to completely avoid allergens, but you can reduce your symptoms by taking steps to limit your exposure to them. It helps to know exactly what you are allergic to so that you can avoid your specific triggers. °SEEK MEDICAL CARE IF:  °· You have a fever. °· You develop a cough that does not stop easily (persistent). °· You have shortness of breath. °· You start wheezing. °· Symptoms interfere with normal daily activities. °Document Released: 12/05/2000 Document Revised: 03/17/2013 Document Reviewed: 11/17/2012 °ExitCare® Patient Information ©2015 [employer]. This information is not intended to replace advice given to you by your health care provider. Make sure you discuss any questions you have with your health care provider. ° °Cough, Adult ° A cough is a reflex that helps clear your throat and airways. It can help heal the body or may be a reaction to an irritated airway. A cough may only last 2 or 3 weeks (acute) or may last more than 8 weeks (chronic).  °CAUSES °Acute cough: °· Viral or bacterial infections. °Chronic cough: °· Infections. °· Allergies. °· Asthma. °· Post-nasal drip. °· Smoking. °· Heartburn or acid reflux. °· Some medicines. °· Chronic lung problems (COPD). °· Cancer. °SYMPTOMS  °· Cough. °· Fever. °· Chest pain. °· Increased breathing rate. °· High-pitched whistling sound when breathing (wheezing). °· Colored mucus that you cough up (sputum). °TREATMENT  °· A bacterial cough may be treated with antibiotic medicine. °· A viral cough must run its course and will not respond to antibiotics. °· Your caregiver may recommend other treatments if you   have a chronic cough. °HOME CARE INSTRUCTIONS  °· Only take over-the-counter or prescription medicines for pain, discomfort, or fever as directed by your caregiver. Use cough suppressants only as directed by your caregiver. °· Use a  cold steam vaporizer or humidifier in your bedroom or home to help loosen secretions. °· Sleep in a semi-upright position if your cough is worse at night. °· Rest as needed. °· Stop smoking if you smoke. °SEEK IMMEDIATE MEDICAL CARE IF:  °· You have pus in your sputum. °· Your cough starts to worsen. °· You cannot control your cough with suppressants and are losing sleep. °· You begin coughing up blood. °· You have difficulty breathing. °· You develop pain which is getting worse or is uncontrolled with medicine. °· You have a fever. °MAKE SURE YOU:  °· Understand these instructions. °· Will watch your condition. °· Will get help right away if you are not doing well or get worse. °Document Released: 09/08/2010 Document Revised: 06/04/2011 Document Reviewed: 09/08/2010 °Exit[employer]® Patient Information ©2015 [employer]. This information is not intended to replace advice given to you by your health care provider. Make sure you discuss any questions you have with your health care provider. ° °

## 2014-03-28 NOTE — ED Provider Notes (Signed)
CSN: [id number]     Arrival date & time 03/28/14  1229 History   First MD Initiated Contact with Patient 03/28/14 1322     Chief Complaint  Patient presents with  . Cough   Kristen Heath is a 49 y.o. female who presents to the emergency department complaining of cough and postnasal drip for the past 6-8 weeks. Patient reports she's been seen in urgent care multiple times and had negative workups for pneumonia. Despite this she was placed on antibiotics each time she visited urgent care. Patient is currently completing a course of Cefdinir. Patient reports all of her chest x-rays have been negative for pneumonia. The patient is complaining of lots of postnasal drip with nasal congestion which is causing her cough. Patient reports spitting up her nasal mucus with her cough. The patient reports using Zyrtec for the past several years because it is not helping. The patient reports using albuterol inhaler at home with some relief. Patient also reports that last night she had transient right lower quadrant abdominal pain. This is since resolved. The patient denies fevers, chills, chest congestion,  shortness of breath, wheezing, chest pain, nausea, vomiting, diarrhea, constipation, dysuria, hematuria, urinary frequency, urinary urgency, vaginal discharge, vaginal bleeding, or rashes on her body. The patient's last bowel movement was yesterday and was normal.   (Consider location/radiation/quality/duration/timing/severity/associated sxs/prior Treatment) HPI  Past Medical History  Diagnosis Date  . Headache(784.0)   . Hyperlipidemia   . Dry skin   . Anemia   . Seasonal allergies   . Anxiety     no current meds  . Sleep apnea     recent diagnosis 01/18/13 - no CPAP machine yet  . History of kidney stones     no surgery - passed stone  . Arthritis     knees   Past Surgical History  Procedure Laterality Date  . Arthroscopic right knee     . Cesarean section  1993  . Myomectomy abdominal  approach    . Wisdom tooth extraction    . Abdominal hysterectomy N/A 02/09/2013    Procedure: HYSTERECTOMY ABDOMINAL;  Surgeon: Darlyn Chamber, MD;  Location: [facility];  Service: Gynecology;  Laterality: N/A;  . Unilateral salpingectomy Right 02/09/2013    Procedure: UNILATERAL SALPINGECTOMY;  Surgeon: Darlyn Chamber, MD;  Location: [facility];  Service: Gynecology;  Laterality: Right;   No family history on file. History  Substance Use Topics  . Smoking status: Never Smoker   . Smokeless tobacco: Never Used  . Alcohol Use: No   OB History    No data available     Review of Systems  Constitutional: Negative for fever and chills.  HENT: Positive for congestion, postnasal drip, rhinorrhea and sneezing. Negative for ear discharge, ear pain, facial swelling, mouth sores, nosebleeds, sinus pressure, sore throat and trouble swallowing.   Eyes: Negative for pain, redness and visual disturbance.  Respiratory: Positive for cough. Negative for shortness of breath and wheezing.   Cardiovascular: Negative for chest pain, palpitations and leg swelling.  Gastrointestinal: Negative for nausea, vomiting, abdominal pain and diarrhea.  Genitourinary: Negative for dysuria, urgency, frequency, hematuria, flank pain, vaginal bleeding, difficulty urinating and genital sores.  Musculoskeletal: Negative for back pain and neck pain.  Skin: Negative for rash.  Neurological: Negative for headaches.  All other systems reviewed and are negative.     Allergies  Review of patient's allergies indicates no known allergies.  Home Medications   Prior to Admission medications  Medication Sig Start Date End Date Taking? Authorizing Provider  albuterol (PROVENTIL HFA;VENTOLIN HFA) 108 (90 BASE) MCG/ACT inhaler Inhale into the lungs every 6 (six) hours as needed for wheezing or shortness of breath.   Yes Historical Provider, MD  cefdinir (OMNICEF) 300 MG capsule Take 300 mg by mouth 2 (two) times daily.   Yes  Historical Provider, MD  ammonium lactate (LAC-HYDRIN) 12 % lotion Apply 1 application topically as needed for dry skin.    Historical Provider, MD  atorvastatin (LIPITOR) 80 MG tablet Take 40 mg by mouth daily.    Historical Provider, MD  benzonatate (TESSALON) 100 MG capsule Take 1 capsule (100 mg total) by mouth every 8 (eight) hours. 03/28/14   Verda Cumins Reine Bristow, PA-C  cetirizine (ZYRTEC) 10 MG tablet Take 10 mg by mouth daily.    Historical Provider, MD  docusate sodium (COLACE) 100 MG capsule Take 100 mg by mouth daily as needed for constipation.    Historical Provider, MD  ferrous sulfate 325 (65 FE) MG tablet Take 650 mg by mouth daily with breakfast.    Historical Provider, MD  fexofenadine (ALLEGRA) 60 MG tablet Take 1 tablet (60 mg total) by mouth 2 (two) times daily. 03/28/14   Verda Cumins Marlicia Sroka, PA-C  fluticasone (FLONASE) 50 MCG/ACT nasal spray Place 2 sprays into both nostrils daily. 03/28/14   Verda Cumins Sontee Desena, PA-C  hydrophilic ointment Apply 1 application topically 2 (two) times daily.    Historical Provider, MD  meloxicam (MOBIC) 7.5 MG tablet Take 7.5 mg by mouth daily.    Historical Provider, MD  naproxen sodium (ANAPROX) 220 MG tablet Take 660 mg by mouth as needed.    Historical Provider, MD  oxyCODONE-acetaminophen (PERCOCET) 7.5-325 MG per tablet Take 1 tablet by mouth every 4 (four) hours as needed for pain. 02/11/13   Darlyn Chamber, MD  Prenatal Vit-Fe Fumarate-FA (MULTIVITAMIN-PRENATAL) 27-0.8 MG TABS tablet Take 1 tablet by mouth daily at 12 noon.    Historical Provider, MD  SUMAtriptan (IMITREX) 50 MG tablet Take 50 mg by mouth as needed for migraine. May repeat in 2 hours if headache persists or recurs.    Historical Provider, MD   BP 128/74 mmHg  Pulse 73  Temp(Src) 99.2 F (37.3 C) (Oral)  Resp 16  Wt 210 lb (95.255 kg)  SpO2 97%  LMP 01/22/2013 Physical Exam  Constitutional: She appears well-developed and well-nourished. No distress.  HENT:  Head:  Normocephalic and atraumatic.  Right Ear: External ear normal.  Left Ear: External ear normal.  Mouth/Throat: Oropharynx is clear and moist. No oropharyngeal exudate.  Patient has a left nasal polyp. There is no tonsillar hypertrophy or exudate. There is evidence of postnasal drip. Bilateral tympanic hemorrhage are pearly-gray without erythema or loss of landmarks. There is no  Ethmoid, maxillary or frontal sinus tenderness.  Eyes: Conjunctivae are normal. Pupils are equal, round, and reactive to light. Right eye exhibits no discharge. Left eye exhibits no discharge.  Neck: Neck supple.  Cardiovascular: Normal rate, regular rhythm, normal heart sounds and intact distal pulses.  Exam reveals no gallop and no friction rub.   No murmur heard. Pulmonary/Chest: Effort normal and breath sounds normal. No respiratory distress. She has no wheezes. She has no rales.  Abdominal: Soft. Bowel sounds are normal. She exhibits no distension and no mass. There is no tenderness. There is no rebound and no guarding.  Patient's abdomen is soft and nontender to palpation. Bowel sounds are present.  Musculoskeletal: She exhibits  no edema.  Lymphadenopathy:    She has no cervical adenopathy.  Neurological: She is alert. Coordination normal.  Skin: Skin is warm and dry. No rash noted. She is not diaphoretic. No erythema. No pallor.  Psychiatric: She has a normal mood and affect. Her behavior is normal.  Nursing note and vitals reviewed.   ED Course  Procedures (including critical care time) Labs Review Labs Reviewed - No data to display  Imaging Review Dg Chest 2 View  03/28/2014   CLINICAL DATA:  Cough for 1 and a half months.  EXAM: CHEST  2 VIEW  COMPARISON:  None.  FINDINGS: The heart size and mediastinal contours are within normal limits. There is no focal infiltrate, pulmonary edema, or pleural effusion. The visualized skeletal structures are unremarkable.  IMPRESSION: No active cardiopulmonary disease.    Electronically Signed   By: Abelardo Diesel M.D.   On: 03/28/2014 12:59     EKG Interpretation None      Filed Vitals:   03/28/14 1241 03/28/14 1416  BP: 125/72 128/74  Pulse: 81 73  Temp: 99 F (37.2 C) 99.2 F (37.3 C)  TempSrc: Oral Oral  Resp: 18 16  Weight: 210 lb (95.255 kg)   SpO2: 100% 97%     MDM   Meds given in ED:  Medications - No data to display  Discharge Medication List as of 03/28/2014  2:12 PM    START taking these medications   Details  benzonatate (TESSALON) 100 MG capsule Take 1 capsule (100 mg total) by mouth every 8 (eight) hours., Starting 03/28/2014, Until Discontinued, Print    fexofenadine (ALLEGRA) 60 MG tablet Take 1 tablet (60 mg total) by mouth 2 (two) times daily., Starting 03/28/2014, Until Discontinued, Print    fluticasone (FLONASE) 50 MCG/ACT nasal spray Place 2 sprays into both nostrils daily., Starting 03/28/2014, Until Discontinued, Print        Final diagnoses:  Other seasonal allergic rhinitis   There is a 49 year old female who presented to the emergency department complaining of cough,  Nasal congestion and postnasal drip for the past 6-8 weeks. The patient has had multiple chest x-rays and that on several antibiotics for bronchitis by urgent care. The patient reports her cough has continued. The patient denies fevers, shortness of breath or wheezing. The patient denies chest congestion. Patient believes her cough is coming from her postnasal drip. The patient has been on  Zyrtec for the past several years.the patient's chest x-ray is unremarkable. Patient is afebrile and nontoxic-appearing. The patient is not Tachycardic. The patient is not hypoxic. The patient has evidence of postnasal drip on her exam. Patient's lungs are clear to auscultation. Patient also has a left nasal polyp. We'll treat this patient for cough due to postnasal drip.  I advised patient to stop taking Zyrtec and to switch to Allegra. A prescription for Allegra was  provided. I also provided a prescription for Flonase. The patient provided Tessalon Perles for cough. Advised patient she should follow-up with her primary care provider for continued symptoms. Advised patient to return to the emergency department with new or worsening symptoms or new concerns. The patient verbalized understanding and agreement with plan.     Hanley Hays, PA-C 03/29/14 0033  Wandra Arthurs, MD 03/30/14 [phone]

## 2015-11-07 IMAGING — CR DG CHEST 2V
2 series · 2 of 2 positions shown · non-contrast
Comparison: None.

CLINICAL DATA: Cough for 1 and a half months.

EXAM:
CHEST  2 VIEW

[w chest pa]
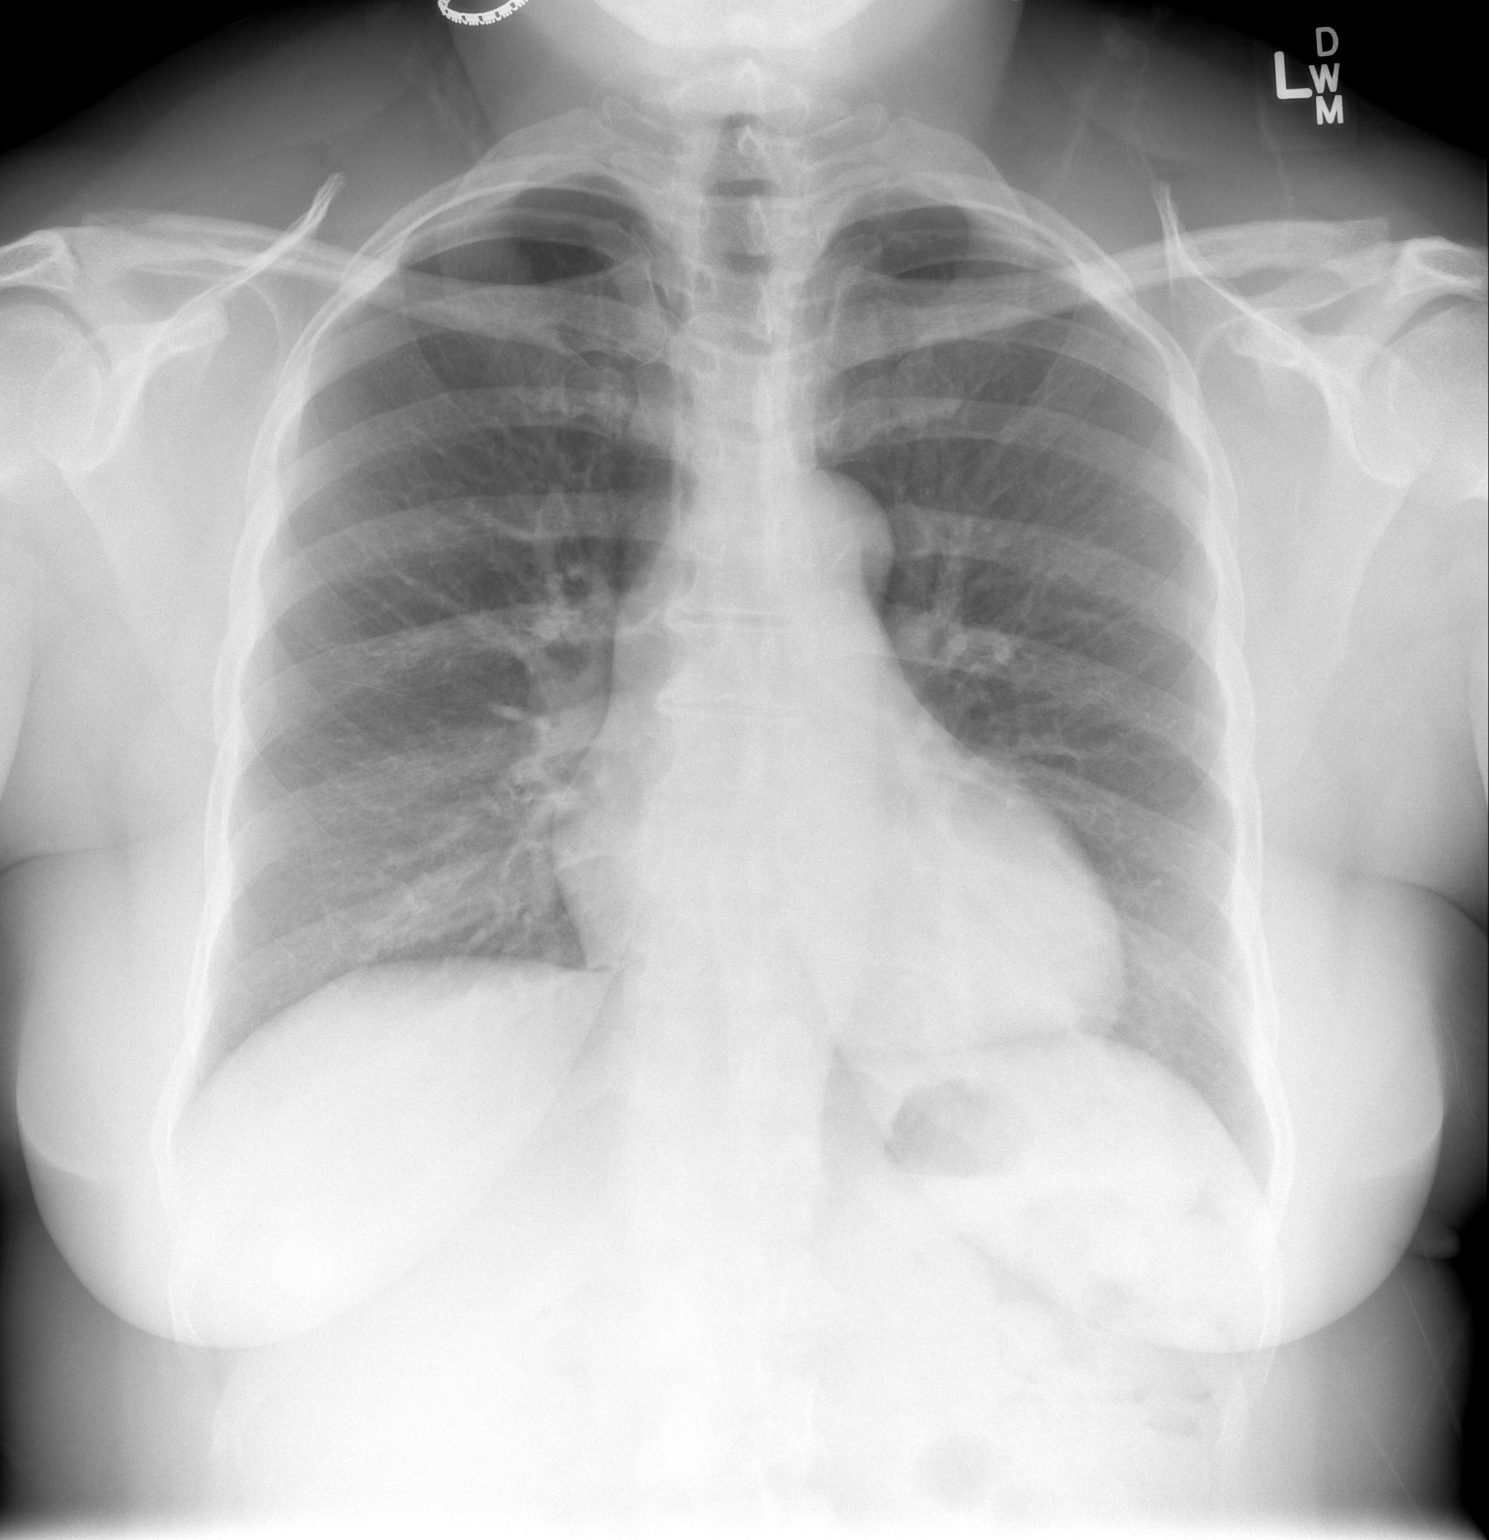

[w chest lat]
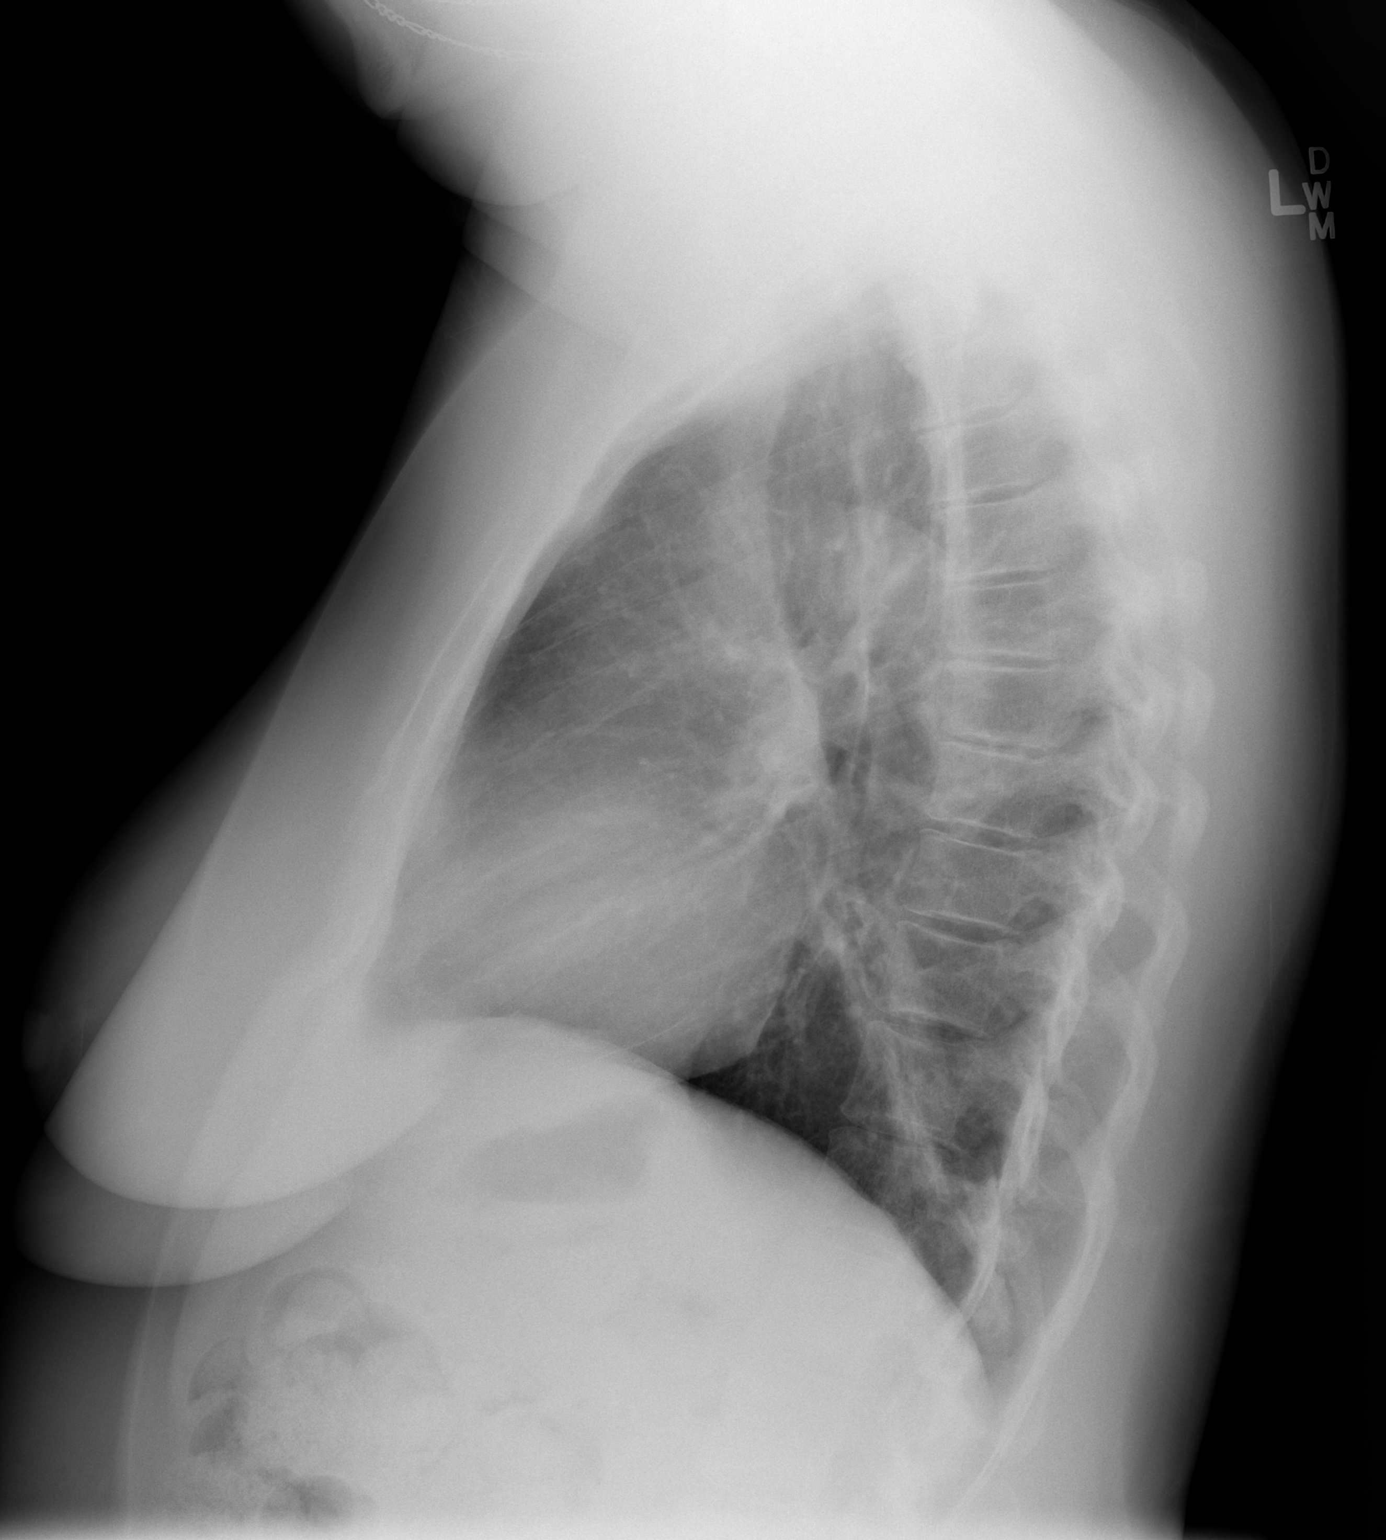

[2 of 2 positions shown; findings below may reference images not displayed]

FINDINGS: The heart size and mediastinal contours are within normal limits.
There is no focal infiltrate, pulmonary edema, or pleural effusion.
The visualized skeletal structures are unremarkable.
IMPRESSION: No active cardiopulmonary disease.

## 2016-04-25 NOTE — H&P (Signed)
Patient name  Kristen Heath, Levier DICTATION# [id number] CSN# [id number]  [facility], MD 04/25/2016 9:24 AM

## 2016-04-26 NOTE — H&P (Signed)
NAME:  Kristen Heath, MEAD NO.:  [id number]  MEDICAL RECORD NO.:  [id number]  LOCATION:                                 FACILITY:  PHYSICIAN:  Darlyn Chamber, M.D.        DATE OF BIRTH:  DATE OF ADMISSION: DATE OF DISCHARGE:                             HISTORY & PHYSICAL   DATE OF SURGERY:  05/08/2016.  The patient is a 51 year old gravida 3, para 40 female, presents for attempted laparoscopic bilateral salpingo-oophorectomy with cystoscopy with possible exploratory surgery.  Past history significant in 2003, she underwent a previous myomectomy.  In 2014 because of pain discomfort, she underwent a total abdominal hysterectomy, finding of recurrent fibroids and pelvic adhesions.  She has been having trouble with continued lower abdominal and pelvic pain discomfort, had a CT done at the [place] with diagnosis of diverticulosis and an ovarian cyst.  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  We discussed options including laparoscopy with lysis of adhesions versus conservative followup for surgical management.  The patient is in favor of latter and is admitted at the present time for the above-noted procedure.  ALLERGIES:  In terms of allergies, the patient has no known drug allergies.  MEDICATIONS:  She is on atorvastatin, Ambien as needed, and albuterol as needed.  PAST MEDICAL HISTORY:  She has a history of diabetes, arthritis, and urinary tract infections.  Also, irritable bowel syndrome and asthma.  PAST SURGICAL HISTORY:  She had a C-section in 1993, had knee surgery in 1997, had myomectomy in 2003, and hysterectomy in 2014.  SOCIAL HISTORY:  Reveals no tobacco or alcohol use.  FAMILY HISTORY:  Noncontributory.  REVIEW OF SYSTEMS:  Noncontributory.  PHYSICAL EXAMINATION:  GENERAL:  The patient is afebrile. VITAL SIGNS:  Stable vital signs. HEENT:  The patient is normocephalic.   Pupils equal, round, and reactive to light and accommodation.  Extraocular movements are intact.  Sclerae and conjunctivae are clear.  Oropharynx clear. BREASTS:  Not examined. LUNGS:  Clear. CARDIOVASCULAR SYSTEM:  Regular rate.  No murmurs or gallops.  No carotid or abdominal bruits. ABDOMEN:  Well-healed low-transverse incision, otherwise benign. PELVIC:  Normal external genitalia.  Vaginal mucosa is clear.  Cuff intact.  Bimanual exam unremarkable. EXTREMITIES:  Trace edema. NEUROLOGIC:  Grossly within normal limits.  IMPRESSION: 1. Pelvic pain, possibly secondary to recurrent pelvic adhesions. 2. Previous myomectomy. 3. Previous hysterectomy.  PLAN:  The patient will undergo attempted laparoscopic bilateral salpingo-oophorectomy with cystoscopy.  If extensive adhesions are noted, we will proceed with exploratory surgery with exploratory bilateral salpingo-oophorectomy.  The nature of the procedure has been discussed as well as alternatives.  The risks have been explained including the risk of infection.  Risk of hemorrhage that could require transfusion with the risk of AIDS or hepatitis.  Risk of injury to adjacent organs including bladder, bowel, ureters that could require further exploratory surgery.  Risk of deep venous thrombosis and pulmonary embolus.  The patient expressed understanding of the indications and potential risks.     Darlyn Chamber, M.D.     JSM/MEDQ  D:  04/25/2016  T:  04/26/2016  Job:  [id number] 

## 2016-04-27 NOTE — Patient Instructions (Addendum)
Your procedure is scheduled on:  Tuesday, Feb. 13, 2018  Enter through the [employer] [facility] at:  6:00 AM  Pick up the phone at the desk and dial [phone].  Call this number if you have problems the morning of surgery: [phone].  Remember: Do NOT eat food or drink after:  Midnight Monday, Feb. 12, 2018  Take these medicines the morning of surgery with a SIP OF WATER:  Hydrochlorothiazide, Zyrtec, Use Inhalers and Nasal prays per normal routine  Bring Asthma Inhaler and CPAP machine day of surgery  Stop ALL herbal medications at this time  Do NOT smoke the day of surgery.  Do NOT wear jewelry (body piercing), metal hair clips/bobby pins, make-up, or nail polish. Do NOT wear lotions, powders, or perfumes.  You may wear deodorant. Do NOT shave for 48 hours prior to surgery. Do NOT bring valuables to the hospital. Contacts, dentures, or bridgework may not be worn into surgery.  Have a responsible adult drive you home and stay with you for 24 hours after your procedure  Bring a copy of your healthcare power of attorney and living will documents.  **Effective Friday, Jan. 12, 2018, [place] will implement no hospital visitations from children age 39 and younger due to a steady increase in flu activity in our community and hospitals. **

## 2016-04-30 ENCOUNTER — Encounter (HOSPITAL_COMMUNITY): Payer: Self-pay

## 2016-04-30 ENCOUNTER — Encounter (HOSPITAL_COMMUNITY)
Admission: RE | Admit: 2016-04-30 | Discharge: 2016-04-30 | Disposition: A | Discharge status: Home / Self Care | Payer: Federal, State, Local not specified - PPO | Source: Ambulatory Visit | Attending: Obstetrics and Gynecology | Admitting: Obstetrics and Gynecology

## 2016-04-30 DIAGNOSIS — Z9889 Other specified postprocedural states: Secondary | ICD-10-CM | POA: Insufficient documentation

## 2016-04-30 DIAGNOSIS — E119 Type 2 diabetes mellitus without complications: Secondary | ICD-10-CM | POA: Insufficient documentation

## 2016-04-30 DIAGNOSIS — R102 Pelvic and perineal pain: Secondary | ICD-10-CM | POA: Insufficient documentation

## 2016-04-30 DIAGNOSIS — Z87440 Personal history of urinary (tract) infections: Secondary | ICD-10-CM | POA: Insufficient documentation

## 2016-04-30 DIAGNOSIS — M1990 Unspecified osteoarthritis, unspecified site: Secondary | ICD-10-CM | POA: Diagnosis not present

## 2016-04-30 DIAGNOSIS — Z90710 Acquired absence of both cervix and uterus: Secondary | ICD-10-CM | POA: Diagnosis not present

## 2016-04-30 DIAGNOSIS — Z01812 Encounter for preprocedural laboratory examination: Secondary | ICD-10-CM | POA: Insufficient documentation

## 2016-04-30 DIAGNOSIS — Z79899 Other long term (current) drug therapy: Secondary | ICD-10-CM | POA: Insufficient documentation

## 2016-04-30 HISTORY — DX: Unspecified asthma, uncomplicated: J45.909

## 2016-04-30 HISTORY — DX: Post-traumatic stress disorder, unspecified: F43.10

## 2016-04-30 HISTORY — DX: Gestational diabetes mellitus in pregnancy, unspecified control: O24.419

## 2016-04-30 HISTORY — DX: Essential (primary) hypertension: I10

## 2016-04-30 HISTORY — DX: Prediabetes: R73.03

## 2016-04-30 HISTORY — DX: Dependence on other enabling machines and devices: Z99.89

## 2016-04-30 LAB — TYPE AND SCREEN
ABO/RH(D): O POS
Antibody Screen: NEGATIVE

## 2016-04-30 LAB — CBC
HEMATOCRIT: 36.4 % (ref 36.0–46.0)
Hemoglobin: 12.1 g/dL (ref 12.0–15.0)
MCH: 28.8 pg (ref 26.0–34.0)
MCHC: 33.2 g/dL (ref 30.0–36.0)
MCV: 86.7 fL (ref 78.0–100.0)
PLATELETS: 266 10*3/uL (ref 150–400)
RBC: 4.2 MIL/uL (ref 3.87–5.11)
RDW: 13.9 % (ref 11.5–15.5)
WBC: 4.9 10*3/uL (ref 4.0–10.5)

## 2016-04-30 LAB — BASIC METABOLIC PANEL
ANION GAP: 7 (ref 5–15)
BUN: 19 mg/dL (ref 6–20)
CO2: 29 mmol/L (ref 22–32)
Calcium: 8.9 mg/dL (ref 8.9–10.3)
Chloride: 102 mmol/L (ref 101–111)
Creatinine, Ser: 0.8 mg/dL (ref 0.44–1.00)
Glucose, Bld: 172 mg/dL — ABNORMAL HIGH (ref 65–99)
POTASSIUM: 3.3 mmol/L — AB (ref 3.5–5.1)
Sodium: 138 mmol/L (ref 135–145)

## 2016-04-30 LAB — ABO/RH: ABO/RH(D): O POS

## 2016-05-07 ENCOUNTER — Encounter (HOSPITAL_COMMUNITY): Payer: Self-pay | Admitting: Anesthesiology

## 2016-05-08 ENCOUNTER — Encounter (HOSPITAL_COMMUNITY): Payer: Self-pay | Admitting: Emergency Medicine

## 2016-05-08 ENCOUNTER — Encounter (HOSPITAL_COMMUNITY)
Admission: RE | Disposition: A | Discharge status: Home / Self Care | Payer: Self-pay | Source: Ambulatory Visit | Attending: Obstetrics and Gynecology

## 2016-05-08 ENCOUNTER — Ambulatory Visit (HOSPITAL_COMMUNITY): Payer: Federal, State, Local not specified - PPO | Admitting: Anesthesiology

## 2016-05-08 ENCOUNTER — Inpatient Hospital Stay (HOSPITAL_COMMUNITY)
Admission: RE | Admit: 2016-05-08 | Discharge: 2016-05-10 | DRG: 743 | Disposition: A | Discharge status: Home / Self Care | Payer: Federal, State, Local not specified - PPO | Source: Ambulatory Visit | Attending: Obstetrics and Gynecology | Admitting: Obstetrics and Gynecology

## 2016-05-08 DIAGNOSIS — Z90722 Acquired absence of ovaries, bilateral: Secondary | ICD-10-CM

## 2016-05-08 DIAGNOSIS — R102 Pelvic and perineal pain: Secondary | ICD-10-CM | POA: Diagnosis present

## 2016-05-08 DIAGNOSIS — Z90710 Acquired absence of both cervix and uterus: Secondary | ICD-10-CM

## 2016-05-08 DIAGNOSIS — I10 Essential (primary) hypertension: Secondary | ICD-10-CM | POA: Diagnosis present

## 2016-05-08 DIAGNOSIS — Z6838 Body mass index (BMI) 38.0-38.9, adult: Secondary | ICD-10-CM

## 2016-05-08 DIAGNOSIS — N736 Female pelvic peritoneal adhesions (postinfective): Principal | ICD-10-CM | POA: Diagnosis present

## 2016-05-08 HISTORY — PX: LAPAROSCOPIC SALPINGO OOPHERECTOMY: SHX5927

## 2016-05-08 HISTORY — PX: CYSTOSCOPY: SHX5120

## 2016-05-08 HISTORY — PX: SALPINGOOPHORECTOMY: SHX82

## 2016-05-08 LAB — GLUCOSE, CAPILLARY: Glucose-Capillary: 140 mg/dL — ABNORMAL HIGH (ref 65–99)

## 2016-05-08 SURGERY — SALPINGO-OOPHORECTOMY, LAPAROSCOPIC
Anesthesia: General | Site: Urethra

## 2016-05-08 MED ORDER — MIDAZOLAM HCL 2 MG/2ML IJ SOLN
INTRAMUSCULAR | Status: AC
  Filled 2016-05-08: qty 2

## 2016-05-08 MED ORDER — FENTANYL CITRATE (PF) 100 MCG/2ML IJ SOLN
INTRAMUSCULAR | Status: AC
  Administered 2016-05-08: 50 ug via INTRAVENOUS
  Filled 2016-05-08: qty 2

## 2016-05-08 MED ORDER — NALOXONE HCL 0.4 MG/ML IJ SOLN: 0.4000 mg | INTRAMUSCULAR | Status: DC | PRN

## 2016-05-08 MED ORDER — BUPIVACAINE HCL (PF) 0.25 % IJ SOLN
INTRAMUSCULAR | Status: AC
  Filled 2016-05-08: qty 30

## 2016-05-08 MED ORDER — ROCURONIUM BROMIDE 100 MG/10ML IV SOLN
INTRAVENOUS | Status: AC
  Filled 2016-05-08: qty 1

## 2016-05-08 MED ORDER — DEXAMETHASONE SODIUM PHOSPHATE 4 MG/ML IJ SOLN
INTRAMUSCULAR | Status: DC | PRN
  Administered 2016-05-08: 4 mg via INTRAVENOUS

## 2016-05-08 MED ORDER — LACTATED RINGERS IV SOLN
INTRAVENOUS | Status: DC
  Administered 2016-05-08 (×2): via INTRAVENOUS

## 2016-05-08 MED ORDER — NEOSTIGMINE METHYLSULFATE 10 MG/10ML IV SOLN
INTRAVENOUS | Status: AC
Start: 2016-05-08 — End: 2016-05-08
  Filled 2016-05-08: qty 1

## 2016-05-08 MED ORDER — HYDROMORPHONE HCL 1 MG/ML IJ SOLN
0.5000 mg | INTRAMUSCULAR | Status: DC | PRN
  Administered 2016-05-08 (×2): 0.5 mg via INTRAVENOUS
  Filled 2016-05-08 (×2): qty 1

## 2016-05-08 MED ORDER — GLYCOPYRROLATE 0.2 MG/ML IJ SOLN
INTRAMUSCULAR | Status: DC | PRN
  Administered 2016-05-08: 0.1 mg via INTRAVENOUS

## 2016-05-08 MED ORDER — MEPERIDINE HCL 25 MG/ML IJ SOLN: 6.2500 mg | INTRAMUSCULAR | Status: DC | PRN

## 2016-05-08 MED ORDER — SUGAMMADEX SODIUM 200 MG/2ML IV SOLN
INTRAVENOUS | Status: AC
  Filled 2016-05-08: qty 2

## 2016-05-08 MED ORDER — ONDANSETRON HCL 4 MG/2ML IJ SOLN
4.0000 mg | Freq: Once | INTRAMUSCULAR | Status: AC | PRN
  Administered 2016-05-08: 4 mg via INTRAVENOUS

## 2016-05-08 MED ORDER — PHENYLEPHRINE HCL 10 MG/ML IJ SOLN
INTRAMUSCULAR | Status: DC | PRN
  Administered 2016-05-08 (×4): 40 ug via INTRAVENOUS

## 2016-05-08 MED ORDER — DIPHENHYDRAMINE HCL 12.5 MG/5ML PO ELIX: 12.5000 mg | ORAL_SOLUTION | Freq: Four times a day (QID) | ORAL | Status: DC | PRN

## 2016-05-08 MED ORDER — FENTANYL CITRATE (PF) 100 MCG/2ML IJ SOLN
INTRAMUSCULAR | Status: AC
  Administered 2016-05-08: 25 ug via INTRAVENOUS
  Filled 2016-05-08: qty 2

## 2016-05-08 MED ORDER — ONDANSETRON HCL 4 MG/2ML IJ SOLN
INTRAMUSCULAR | Status: AC
  Filled 2016-05-08: qty 2

## 2016-05-08 MED ORDER — SUGAMMADEX SODIUM 200 MG/2ML IV SOLN
INTRAVENOUS | Status: DC | PRN
  Administered 2016-05-08: 200 mg via INTRAVENOUS

## 2016-05-08 MED ORDER — KETOROLAC TROMETHAMINE 30 MG/ML IJ SOLN
INTRAMUSCULAR | Status: AC
  Administered 2016-05-08: 30 mg via INTRAVENOUS
  Filled 2016-05-08: qty 1

## 2016-05-08 MED ORDER — SUGAMMADEX SODIUM 500 MG/5ML IV SOLN
INTRAVENOUS | Status: AC
  Filled 2016-05-08: qty 5

## 2016-05-08 MED ORDER — PHENYLEPHRINE 40 MCG/ML (10ML) SYRINGE FOR IV PUSH (FOR BLOOD PRESSURE SUPPORT)
PREFILLED_SYRINGE | INTRAVENOUS | Status: AC
  Filled 2016-05-08: qty 10

## 2016-05-08 MED ORDER — MIDAZOLAM HCL 2 MG/2ML IJ SOLN
INTRAMUSCULAR | Status: DC | PRN
  Administered 2016-05-08: 2 mg via INTRAVENOUS

## 2016-05-08 MED ORDER — ONDANSETRON HCL 4 MG/2ML IJ SOLN: 4.0000 mg | Freq: Four times a day (QID) | INTRAMUSCULAR | Status: DC | PRN

## 2016-05-08 MED ORDER — SCOPOLAMINE 1 MG/3DAYS TD PT72
1.0000 | MEDICATED_PATCH | Freq: Once | TRANSDERMAL | Status: DC
  Administered 2016-05-08: 1.5 mg via TRANSDERMAL

## 2016-05-08 MED ORDER — BUPIVACAINE HCL (PF) 0.25 % IJ SOLN
INTRAMUSCULAR | Status: DC | PRN
  Administered 2016-05-08: 8 mL

## 2016-05-08 MED ORDER — SCOPOLAMINE 1 MG/3DAYS TD PT72
MEDICATED_PATCH | TRANSDERMAL | Status: AC
  Administered 2016-05-08: 1.5 mg via TRANSDERMAL
  Filled 2016-05-08: qty 1

## 2016-05-08 MED ORDER — HYDROMORPHONE 1 MG/ML IV SOLN
INTRAVENOUS | Status: DC
  Administered 2016-05-08: 12:00:00 via INTRAVENOUS
  Filled 2016-05-08 (×2): qty 25

## 2016-05-08 MED ORDER — MENTHOL 3 MG MT LOZG: 1.0000 | LOZENGE | OROMUCOSAL | Status: DC | PRN

## 2016-05-08 MED ORDER — LACTATED RINGERS IV SOLN
INTRAVENOUS | Status: DC
  Administered 2016-05-08 – 2016-05-09 (×3): via INTRAVENOUS

## 2016-05-08 MED ORDER — ACETAMINOPHEN 10 MG/ML IV SOLN
1000.0000 mg | Freq: Once | INTRAVENOUS | Status: AC
  Administered 2016-05-08: 1000 mg via INTRAVENOUS
  Filled 2016-05-08: qty 100

## 2016-05-08 MED ORDER — ACETAMINOPHEN 325 MG PO TABS: 650.0000 mg | ORAL_TABLET | ORAL | Status: DC | PRN

## 2016-05-08 MED ORDER — FENTANYL CITRATE (PF) 250 MCG/5ML IJ SOLN
INTRAMUSCULAR | Status: AC
  Filled 2016-05-08: qty 5

## 2016-05-08 MED ORDER — LIDOCAINE HCL (CARDIAC) 20 MG/ML IV SOLN
INTRAVENOUS | Status: DC | PRN
  Administered 2016-05-08: 50 mg via INTRAVENOUS

## 2016-05-08 MED ORDER — SODIUM CHLORIDE 0.9 % IR SOLN
Status: DC | PRN
  Administered 2016-05-08: 1000 mL

## 2016-05-08 MED ORDER — KETOROLAC TROMETHAMINE 30 MG/ML IJ SOLN
30.0000 mg | Freq: Once | INTRAMUSCULAR | Status: AC
  Administered 2016-05-08: 30 mg via INTRAVENOUS

## 2016-05-08 MED ORDER — OXYCODONE-ACETAMINOPHEN 5-325 MG PO TABS
1.0000 | ORAL_TABLET | ORAL | Status: DC | PRN
  Administered 2016-05-08 – 2016-05-10 (×6): 2 via ORAL
  Filled 2016-05-08 (×6): qty 2

## 2016-05-08 MED ORDER — CEFAZOLIN SODIUM-DEXTROSE 2-4 GM/100ML-% IV SOLN
2.0000 g | INTRAVENOUS | Status: AC
  Administered 2016-05-08: 2 g via INTRAVENOUS

## 2016-05-08 MED ORDER — LIDOCAINE HCL (CARDIAC) 20 MG/ML IV SOLN
INTRAVENOUS | Status: AC
  Filled 2016-05-08: qty 5

## 2016-05-08 MED ORDER — FENTANYL CITRATE (PF) 100 MCG/2ML IJ SOLN
25.0000 ug | INTRAMUSCULAR | Status: DC | PRN
  Administered 2016-05-08 (×2): 50 ug via INTRAVENOUS
  Administered 2016-05-08 (×2): 25 ug via INTRAVENOUS

## 2016-05-08 MED ORDER — FLUORESCEIN SODIUM 10 % IV SOLN
INTRAVENOUS | Status: DC | PRN
  Administered 2016-05-08: 500 mg via INTRAVENOUS

## 2016-05-08 MED ORDER — SODIUM CHLORIDE 0.9% FLUSH: 9.0000 mL | INTRAVENOUS | Status: DC | PRN

## 2016-05-08 MED ORDER — ONDANSETRON HCL 4 MG PO TABS: 4.0000 mg | ORAL_TABLET | Freq: Four times a day (QID) | ORAL | Status: DC | PRN

## 2016-05-08 MED ORDER — PROPOFOL 10 MG/ML IV BOLUS
INTRAVENOUS | Status: DC | PRN
  Administered 2016-05-08: 150 mg via INTRAVENOUS

## 2016-05-08 MED ORDER — HYDROMORPHONE HCL 1 MG/ML IJ SOLN
INTRAMUSCULAR | Status: AC
  Filled 2016-05-08: qty 1

## 2016-05-08 MED ORDER — FLUORESCEIN SODIUM 10 % IV SOLN
INTRAVENOUS | Status: AC
  Filled 2016-05-08: qty 5

## 2016-05-08 MED ORDER — DIPHENHYDRAMINE HCL 50 MG/ML IJ SOLN: 12.5000 mg | Freq: Four times a day (QID) | INTRAMUSCULAR | Status: DC | PRN

## 2016-05-08 MED ORDER — ROCURONIUM BROMIDE 100 MG/10ML IV SOLN
INTRAVENOUS | Status: DC | PRN
  Administered 2016-05-08: 40 mg via INTRAVENOUS
  Administered 2016-05-08 (×3): 10 mg via INTRAVENOUS

## 2016-05-08 MED ORDER — FENTANYL CITRATE (PF) 100 MCG/2ML IJ SOLN
INTRAMUSCULAR | Status: DC | PRN
  Administered 2016-05-08: 25 ug via INTRAVENOUS
  Administered 2016-05-08 (×2): 50 ug via INTRAVENOUS
  Administered 2016-05-08: 125 ug via INTRAVENOUS

## 2016-05-08 MED ORDER — ALBUTEROL SULFATE (2.5 MG/3ML) 0.083% IN NEBU: 2.5000 mg | INHALATION_SOLUTION | Freq: Four times a day (QID) | RESPIRATORY_TRACT | Status: DC | PRN

## 2016-05-08 MED ORDER — HYDROMORPHONE HCL 1 MG/ML IJ SOLN
INTRAMUSCULAR | Status: DC | PRN
  Administered 2016-05-08 (×2): 0.5 mg via INTRAVENOUS

## 2016-05-08 MED ORDER — DEXAMETHASONE SODIUM PHOSPHATE 10 MG/ML IJ SOLN
INTRAMUSCULAR | Status: AC
  Filled 2016-05-08: qty 1

## 2016-05-08 MED ORDER — PROPOFOL 10 MG/ML IV BOLUS
INTRAVENOUS | Status: AC
  Filled 2016-05-08: qty 20

## 2016-05-08 SURGICAL SUPPLY — 48 items
ADH SKN CLS APL DERMABOND .7 (GAUZE/BANDAGES/DRESSINGS) ×3
BAG SPEC RTRVL LRG 6X4 10 (ENDOMECHANICALS)
CABLE HIGH FREQUENCY MONO STRZ (ELECTRODE) IMPLANT
CANISTER SUCTION 2500CC (MISCELLANEOUS) ×2 IMPLANT
CATH ROBINSON RED A/P 16FR (CATHETERS) IMPLANT
CLOTH BEACON ORANGE TIMEOUT ST (SAFETY) ×5 IMPLANT
COVER TABLE BACK 60X90 (DRAPES) ×2 IMPLANT
DERMABOND ADVANCED (GAUZE/BANDAGES/DRESSINGS) ×2
DERMABOND ADVANCED .7 DNX12 (GAUZE/BANDAGES/DRESSINGS) ×3 IMPLANT
DISSECTOR ROUND CHERRY 3/8 STR (MISCELLANEOUS) ×2 IMPLANT
ELECT REM PT RETURN 9FT ADLT (ELECTROSURGICAL) ×5
ELECTRODE REM PT RTRN 9FT ADLT (ELECTROSURGICAL) ×3 IMPLANT
GLOVE BIO SURGEON STRL SZ7 (GLOVE) ×5 IMPLANT
GLOVE BIOGEL PI IND STRL 7.0 (GLOVE) ×3 IMPLANT
GLOVE BIOGEL PI INDICATOR 7.0 (GLOVE) ×2
GOWN STRL REUS W/TWL LRG LVL3 (GOWN DISPOSABLE) ×10 IMPLANT
NEEDLE INSUFFLATION 120MM (ENDOMECHANICALS) IMPLANT
NS IRRIG 1000ML POUR BTL (IV SOLUTION) ×5 IMPLANT
PACK LAPAROSCOPY BASIN (CUSTOM PROCEDURE TRAY) ×5 IMPLANT
PACK TRENDGUARD 450 HYBRID PRO (MISCELLANEOUS) IMPLANT
PENCIL BUTTON HOLSTER BLD 10FT (ELECTRODE) ×2 IMPLANT
POUCH SPECIMEN RETRIEVAL 10MM (ENDOMECHANICALS) IMPLANT
PROTECTOR NERVE ULNAR (MISCELLANEOUS) ×10 IMPLANT
SCISSORS LAP 5X35 DISP (ENDOMECHANICALS) IMPLANT
SCISSORS LAP 5X45 EPIX DISP (ENDOMECHANICALS) IMPLANT
SEALER TISSUE G2 CVD JAW 45CM (ENDOMECHANICALS) IMPLANT
SET IRRIG TUBING LAPAROSCOPIC (IRRIGATION / IRRIGATOR) IMPLANT
SLEEVE XCEL OPT CAN 5 100 (ENDOMECHANICALS) ×5 IMPLANT
SPONGE LAP 18X18 X RAY DECT (DISPOSABLE) ×4 IMPLANT
SUT CHROMIC 0 CT 1 (SUTURE) ×2 IMPLANT
SUT MNCRL AB 4-0 PS2 18 (SUTURE) ×2 IMPLANT
SUT PDS AB 0 CTX 60 (SUTURE) ×2 IMPLANT
SUT PDS AB 1 CT  36 (SUTURE) ×4
SUT PDS AB 1 CT 36 (SUTURE) IMPLANT
SUT PLAIN 2 0 XLH (SUTURE) ×2 IMPLANT
SUT VIC AB 3-0 PS2 18 (SUTURE) ×5
SUT VIC AB 3-0 PS2 18XBRD (SUTURE) ×3 IMPLANT
SUT VICRYL 0 ENDOLOOP (SUTURE) IMPLANT
SUT VICRYL 0 TIES 12 18 (SUTURE) ×2 IMPLANT
SUT VICRYL 0 UR6 27IN ABS (SUTURE) IMPLANT
TOWEL OR 17X24 6PK STRL BLUE (TOWEL DISPOSABLE) ×10 IMPLANT
TRENDGUARD 450 HYBRID PRO PACK (MISCELLANEOUS) ×5
TROCAR BALLN 12MMX100 BLUNT (TROCAR) ×2 IMPLANT
TROCAR OPTI TIP 5M 100M (ENDOMECHANICALS) IMPLANT
TROCAR XCEL DIL TIP R 11M (ENDOMECHANICALS) IMPLANT
TUBING SUCTION BULK 100 FT (MISCELLANEOUS) ×2 IMPLANT
WARMER LAPAROSCOPE (MISCELLANEOUS) ×5 IMPLANT
WATER STERILE IRR 1000ML POUR (IV SOLUTION) ×5 IMPLANT

## 2016-05-08 NOTE — Anesthesia Preprocedure Evaluation (Signed)
Anesthesia Evaluation  Patient identified by MRN, date of birth, ID band Patient awake    Reviewed: Allergy & Precautions, H&P , NPO status , Patient's Chart, lab work & pertinent test results  Airway Mallampati: I  TM Distance: >3 FB Neck ROM: full    Dental no notable dental hx. (+) Teeth Intact   Pulmonary sleep apnea ,    Pulmonary exam normal        Cardiovascular hypertension, Pt. on medications Normal cardiovascular exam     Neuro/Psych negative neurological ROS  negative psych ROS   GI/Hepatic negative GI ROS, Neg liver ROS,   Endo/Other  diabetesMorbid obesity  Renal/GU negative Renal ROS  negative genitourinary   Musculoskeletal   Abdominal (+) + obese,   Peds negative pediatric ROS (+)  Hematology   Anesthesia Other Findings   Reproductive/Obstetrics negative OB ROS                             Anesthesia Physical  Anesthesia Plan  ASA: II  Anesthesia Plan: General   Post-op Pain Management:    Induction: Intravenous  Airway Management Planned: Oral ETT  Additional Equipment:   Intra-op Plan:   Post-operative Plan: Extubation in OR  Informed Consent: I have reviewed the patients History and Physical, chart, labs and discussed the procedure including the risks, benefits and alternatives for the proposed anesthesia with the patient or authorized representative who has indicated his/her understanding and acceptance.   Dental Advisory Given  Plan Discussed with: CRNA and Surgeon  Anesthesia Plan Comments:         Anesthesia Quick Evaluation

## 2016-05-08 NOTE — Progress Notes (Signed)
Patient ID: Kristen Heath, female   DOB: July 23, 1965, 51 y.o.   MRN: [id number] AF VSS ABD SOFT DRESSING DRY' GOOD UO

## 2016-05-08 NOTE — Anesthesia Postprocedure Evaluation (Signed)
Anesthesia Post Note  Patient: Kristen Heath  Procedure(s) Performed: Procedure(s) (LRB): ATTEMPTED LAPAROSCOPIC SALPINGO OOPHORECTOMY (Bilateral) CYSTOSCOPY (N/A) BILATERAL SALPINGO OOPHORECTOMY,OPEN  Patient location during evaluation: PACU Anesthesia Type: General Level of consciousness: awake and sedated Pain management: pain level controlled Vital Signs Assessment: post-procedure vital signs reviewed and stable Respiratory status: spontaneous breathing Cardiovascular status: stable Postop Assessment: no signs of nausea or vomiting Anesthetic complications: no        Last Vitals:  Vitals:   05/08/16 1145 05/08/16 1200  BP: 128/66 (!) 113/52  Pulse: 96 87  Resp: (!) 24 20  Temp:  37.3 C    Last Pain:  Vitals:   05/08/16 1200  TempSrc: Oral  PainSc: 3    Pain Goal: Patients Stated Pain Goal: 6 (05/08/16 [id number])               Dejana Pugsley JR,[name]

## 2016-05-08 NOTE — Op Note (Signed)
NAME:  Kristen Heath, Kristen Heath NO.:  [id number]  MEDICAL RECORD NO.:  [id number]  LOCATION:                                 FACILITY:  PHYSICIAN:  Darlyn Chamber, M.D.        DATE OF BIRTH:  DATE OF PROCEDURE:  05/08/2016 DATE OF DISCHARGE:                              OPERATIVE REPORT   PREOPERATIVE DIAGNOSES:  Chronic pelvic pain, probable pelvic adhesions.  POSTOPERATIVE DIAGNOSES:  Chronic pelvic pain, probable pelvic adhesions.  OPERATIVE PROCEDURE:  Attempted open laparoscopy.  Subsequent exploratory laparotomy, lysis of adhesions, bilateral salpingo- oophorectomy.  Cystoscopy.  SURGEON:  Darlyn Chamber, MD.  ASSISTANT:  Joycelyn Schmid, MD.  ANESTHESIA:  General endotracheal.  ESTIMATED BLOOD LOSS:  200 mL.  PACKS:  None.  DRAINS:  Included urethral Foley.  COMPLICATIONS:  None.  INDICATION:  Dictated in history and physical.  PROCEDURE IN DETAIL:  The patient was taken to the OR and placed in supine position.  She was subsequently placed in dorsal supine position using the Allen stirrups.  The patient then underwent general anesthesia.  After satisfactory level was obtained; the abdomen, perineum, and vagina prepped out with Betadine.  A Foley was placed to straight drain.  A sponge on a sponge stick was placed in the vaginal vault.  The patient was draped in sterile field.  Subumbilical incision made with a knife.  Extended through subcutaneous tissue.  Fascia was identified and entered sharply.  Incision was fashioned laterally. Rectus muscles were separated in the midline.  We could not enter the peritoneum.  It looked like she had omental adhesions around the umbilicus that prevented entry.  Decision was to proceed with exploratory surgery.  The subumbilical fascia was closed with 2 figure- of-eights of 0 Vicryl.  Deep tissue was closed with interrupted sutures of 0 plain catgut.  The skin was closed with interrupted sutures of 3-0 Vicryl.  The  patient's legs were repositioned.  A low-transverse incision was made with a knife, carried through subcutaneous tissue. The fascia was entered sharply and incision of the fascia extended laterally.  Rectus muscles were separated.  The peritoneum was entered and the incision of the perineum extended both superiorly and inferiorly.  She did have omental adhesions superiorly.  There was no evidence of bowel injury.  We could identify the area we went through the umbilicus and no injuries were noted.  The Fredia Sorrow retractors were put in place.  Bowel contents were packed superiorly out of the pelvis.  She had dense adhesions in the pelvis.  We first went to the right side.  She did have some adhesions to the sigmoid colon. These were taken down.  The right tube and ovary were identified, elevated with a Babcock tenaculum.  We dissected the adhesions away from the ovary.  The right retroperitoneal space was identified and opened. Ureter was identified easily.  The ovarian vascular was isolated above the ureter.  It was clamped, cut, and doubly ligated first with a free tie of 0 chromic and suture ligature of 0 Vicryl.  Using sharp and blunt dissection, the tube and ovary were dissected from its adhesions.  Main pedicle was clamped, cut, and secured  with a free tie of 0 Vicryl.  The ovary was completely removed.  We had good hemostasis and no evidence of injury to adjacent organs.  We went to the left side.  The sigmoid was densely adherent to that side.  We were able to eventually dissect down the side and identify the ovary.  We dissected the ovary free and elevated with a Babcock tenaculum.  We had trouble identifying the pelvic sidewall due to the adhesions, decision was just to clamp the ovarian vasculature close to the ovary.  It was clamped and cut, secured with a suture ligature of 0 Vicryl and cautery.  We then dissected the ovary and tube further from its adhesions.  We  cross clamped the pedicle that was left, removed the tube and ovary, held pedicles, secured with a free tie of 0 Vicryl.  We irrigated the pelvis.  We had good hemostasis on both sides.  Urine output remained clear and adequate.  At this point in time, the self-retaining retractor and packs were removed.  The peritoneum could not be reapproximated.  We therefore just closed the fascia with a double-looped #0 PDS.  The subcutaneous tissue was closed with interrupted suture of 0 plain catgut.  Skin was closed with running subcuticular 4-0 Monocryl and Dermabond.  The Foley was removed.  Cystoscopy was performed.  The bladder was intact.  Both ureteral orifices were identified.  The patient was given fluorescein.  Green tinged spurts were noted coming from both ureteral orifices.  The cystoscope was then removed.  Foley was placed back to straight drain.  The patient was taken out of the dorsal lithotomy position.  Once alert and extubated, and transferred to the recovery room in good condition.  Sponge, instrument, and needle count were correct by circulating nurse x2.     Darlyn Chamber, M.D.     JSM/MEDQ  D:  05/08/2016  T:  05/08/2016  Job:  [id number]

## 2016-05-08 NOTE — Progress Notes (Signed)
Called to PACU for assistance with patients home CPAP machine. RN wanted 02 bled in for pts sats. While disconnecting the patients circuit to add in 02 adapter, pts home circuit broke. RT went and brought back hospital CPAP circuit and connected it to patients home machine with 4L (per RN) oxygen bled in. Pt is comfortable on home CPAP machine (which was preset) and sats are 97%. RN was informed of RT's number in case any other problems come up.

## 2016-05-08 NOTE — Brief Op Note (Signed)
05/08/2016  10:00 AM  PATIENT:  Kristen Heath  51 y.o. female  PRE-OPERATIVE DIAGNOSIS:  pelvic pain, probable pelvic adhesions  POST-OPERATIVE DIAGNOSIS:  PELVIC PAIN  PROCEDURE:  Procedure(s) with comments: ATTEMPTED LAPAROSCOPIC SALPINGO OOPHORECTOMY (Bilateral) - T. Berline Lopes RNFA confirmed 04/16/16 CYSTOSCOPY (N/A) BILATERAL SALPINGO OOPHORECTOMY,OPEN  SURGEON:  Surgeon(s) and Role:    * Arvella Nigh, MD - Primary    * Everlene Farrier, MD - Assisting  PHYSICIAN ASSISTANT:   ASSISTANTS: [place]   ANESTHESIA:   local and general  EBL:  Total I/O In: 2000 [I.V.:2000] Out: 250 [Urine:150; Blood:100]  BLOOD ADMINISTERED:none  DRAINS: Urinary Catheter (Foley)   LOCAL MEDICATIONS USED:  MARCAINE     SPECIMEN:  Source of Specimen:  bilateral tubes and ovaries  DISPOSITION OF SPECIMEN:  PATHOLOGY  COUNTS:  YES  TOURNIQUET:  * No tourniquets in log *  DICTATION: .Other Dictation: Dictation Number [id number]  PLAN OF CARE: Admit for overnight observation  PATIENT DISPOSITION:  PACU - hemodynamically stable.   Delay start of Pharmacological VTE agent (>24hrs) due to surgical blood loss or risk of bleeding: no

## 2016-05-08 NOTE — Addendum Note (Signed)
Addendum  created 05/08/16 1844 by Flossie Dibble, CRNA   Sign clinical note

## 2016-05-08 NOTE — Anesthesia Postprocedure Evaluation (Addendum)
Anesthesia Post Note  Patient: Kristen Heath  Procedure(s) Performed: Procedure(s) (LRB): ATTEMPTED LAPAROSCOPIC SALPINGO OOPHORECTOMY (Bilateral) CYSTOSCOPY (N/A) [place]  Patient location during evaluation: [facility] Anesthesia Type: General Level of consciousness: awake and alert Pain management: satisfactory to patient Vital Signs Assessment: post-procedure vital signs reviewed and stable Respiratory status: spontaneous breathing and respiratory function stable Cardiovascular status: stable Postop Assessment: adequate PO intake Anesthetic complications: no        Last Vitals:  Vitals:   05/08/16 1522 05/08/16 1712  BP: 115/60 (!) 112/50  Pulse: 95 86  Resp: 20 18  Temp: 37.3 C 37.7 C    Last Pain:  Vitals:   05/08/16 1815  TempSrc:   PainSc: 5    Pain Goal: Patients Stated Pain Goal: 5 (05/08/16 1815)               [name]

## 2016-05-08 NOTE — H&P (Signed)
  History and physical exam unchanged 

## 2016-05-08 NOTE — Progress Notes (Signed)
Pt arrived to floor using CPAP machine fitted over nose with 4L O2 attached.  PCA Dilaudid ordered for pain management which requires ETCO2 monitoring via special nasal canula.  Pt required immediate pain medication, so PCA was started with frequent monitoring.  Consulted with Clarise Cruz, RT who advised RN to attempt using ETCO2 monitor, but also said that it may interfere with effective seal on CPAP machine.  RN did attempt to place ETCO2 nasal canula, but this caused CPAP to leak.  Phoned Dr. Radene Knee and requested alternative pain management method.  See order for PRN IVP Dilaudid.

## 2016-05-08 NOTE — Anesthesia Procedure Notes (Addendum)
Procedure Name: Intubation Date/Time: 05/08/2016 7:30 AM Performed by: Flossie Dibble Pre-anesthesia Checklist: Patient identified, Patient being monitored, Timeout performed, Emergency Drugs available and Suction available Patient Re-evaluated:Patient Re-evaluated prior to inductionOxygen Delivery Method: Circle System Utilized Preoxygenation: Pre-oxygenation with 100% oxygen Intubation Type: IV induction Ventilation: Mask ventilation with difficulty, Two handed mask ventilation required and Oral airway inserted - appropriate to patient size Laryngoscope Size: Mac and 3 Grade View: Grade II Tube type: Oral Tube size: 7.0 mm Number of attempts: 1 Airway Equipment and Method: stylet Placement Confirmation: ETT inserted through vocal cords under direct vision,  positive ETCO2 and breath sounds checked- equal and bilateral Secured at: 22 cm Tube secured with: Tape Dental Injury: Teeth and Oropharynx as per pre-operative assessment  Difficulty Due To: Difficult Airway- due to reduced neck mobility Comments: Pt has braided bun that limited neck extension as well as Trenguard trendelenburg positioner in place, making mask ventilation difficult due to redundant tissue and poor alignment of airway. Airway performed by Wynelle Fanny.

## 2016-05-08 NOTE — Transfer of Care (Signed)
Immediate Anesthesia Transfer of Care Note  Patient: Kristen Heath  Procedure(s) Performed: Procedure(s) with comments: ATTEMPTED LAPAROSCOPIC SALPINGO OOPHORECTOMY (Bilateral) - T. Berline Lopes RNFA confirmed 04/16/16 CYSTOSCOPY (N/A) BILATERAL SALPINGO OOPHORECTOMY,OPEN  Patient Location: PACU  Anesthesia Type:General  Level of Consciousness: awake, alert  and oriented  Airway & Oxygen Therapy: Patient Spontanous Breathing and Patient connected to face mask oxygen  Post-op Assessment: Report given to RN and Post -op Vital signs reviewed and stable  Post vital signs: Reviewed and stable  Last Vitals:  Vitals:   05/08/16 0624  BP: 135/79  Resp: 16  Temp: 36.9 C    Last Pain:  Vitals:   05/08/16 0624  TempSrc: Oral  PainSc: 3       Patients Stated Pain Goal: 6 ([id number])  Complications: No apparent anesthesia complications

## 2016-05-09 ENCOUNTER — Encounter (HOSPITAL_COMMUNITY): Payer: Self-pay | Admitting: Obstetrics and Gynecology

## 2016-05-09 DIAGNOSIS — R102 Pelvic and perineal pain: Secondary | ICD-10-CM | POA: Diagnosis present

## 2016-05-09 DIAGNOSIS — Z6838 Body mass index (BMI) 38.0-38.9, adult: Secondary | ICD-10-CM | POA: Diagnosis not present

## 2016-05-09 DIAGNOSIS — Z90710 Acquired absence of both cervix and uterus: Secondary | ICD-10-CM | POA: Diagnosis not present

## 2016-05-09 DIAGNOSIS — I10 Essential (primary) hypertension: Secondary | ICD-10-CM | POA: Diagnosis present

## 2016-05-09 DIAGNOSIS — N736 Female pelvic peritoneal adhesions (postinfective): Secondary | ICD-10-CM | POA: Diagnosis present

## 2016-05-09 LAB — CBC
HCT: 30.5 % — ABNORMAL LOW (ref 36.0–46.0)
Hemoglobin: 10.4 g/dL — ABNORMAL LOW (ref 12.0–15.0)
MCH: 29.3 pg (ref 26.0–34.0)
MCHC: 34.1 g/dL (ref 30.0–36.0)
MCV: 85.9 fL (ref 78.0–100.0)
PLATELETS: 226 10*3/uL (ref 150–400)
RBC: 3.55 MIL/uL — ABNORMAL LOW (ref 3.87–5.11)
RDW: 14.1 % (ref 11.5–15.5)
WBC: 9.5 10*3/uL (ref 4.0–10.5)

## 2016-05-09 NOTE — Progress Notes (Signed)
1 Day Post-Op Procedure(s) (LRB): ATTEMPTED LAPAROSCOPIC SALPINGO OOPHORECTOMY (Bilateral) CYSTOSCOPY (N/A) BILATERAL SALPINGO OOPHORECTOMY,OPEN  Subjective: Patient reports tolerating PO.    Objective: I have reviewed patient's vital signs and labs.  General: alert GI: soft, non-tender; bowel sounds normal; no masses,  no organomegaly and incision: clean  Assessment: s/p Procedure(s) with comments: ATTEMPTED LAPAROSCOPIC SALPINGO OOPHORECTOMY (Bilateral) - T. Berline Lopes RNFA confirmed 04/16/16 CYSTOSCOPY (N/A) BILATERAL SALPINGO OOPHORECTOMY,OPEN: stable  Plan: Advance diet Discontinue IV fluids  LOS: 1 day    Kristen Heath S 05/09/2016, 7:55 AM

## 2016-05-10 MED ORDER — OXYCODONE-ACETAMINOPHEN 7.5-325 MG PO TABS: 1.0000 | ORAL_TABLET | ORAL | 0 refills | Status: DC | PRN

## 2016-05-10 NOTE — Discharge Summary (Signed)
Patient name  Kristen Heath, Kristen Heath DICTATION# [id number] CSN# [id number]  [facility], MD 05/10/2016 8:45 AM

## 2016-05-10 NOTE — Progress Notes (Signed)
Discharge teaching complete. Pt understood all information and did not have any questions. Pt discharged home to family. 

## 2016-05-10 NOTE — Progress Notes (Signed)
2 Days Post-Op Procedure(s) (LRB): ATTEMPTED LAPAROSCOPIC SALPINGO OOPHORECTOMY (Bilateral) CYSTOSCOPY (N/A) BILATERAL SALPINGO OOPHORECTOMY,OPEN  Subjective: Patient reports tolerating PO and no problems voiding.    Objective: I have reviewed patient's vital signs and labs.  General: alert GI: soft, non-tender; bowel sounds normal; no masses,  no organomegaly and incision: clean  Assessment: s/p Procedure(s) with comments: ATTEMPTED LAPAROSCOPIC SALPINGO OOPHORECTOMY (Bilateral) - T. Berline Lopes RNFA confirmed 04/16/16 CYSTOSCOPY (N/A) BILATERAL SALPINGO OOPHORECTOMY,OPEN: stable  Plan: Discharge home  LOS: 2 days    Kristen Heath S 05/10/2016, 8:40 AM

## 2016-05-10 NOTE — Discharge Summary (Signed)
[name], Kristen Heath         ACCOUNT NO.:  [id number]  MEDICAL RECORD NO.:  [id number]  LOCATION:                                 FACILITY:  PHYSICIAN:  Darlyn Chamber, M.D.        DATE OF BIRTH:  DATE OF ADMISSION:  05/08/2016 DATE OF DISCHARGE:  05/10/2016                              DISCHARGE SUMMARY   ADMITTING DIAGNOSIS:  Pelvic pain secondary to adhesions.  POSTOPERATIVE DIAGNOSIS:  Pelvic pain secondary to adhesions.  PROCEDURE:  Attempted laparoscopic evaluation, subsequent exploratory surgery with bilateral salpingo-oophorectomy.  For complete history and physical, please see dictated note.  COURSE IN THE HOSPITAL:  The patient underwent above-noted surgery.  We did keep her through the first postoperative day.  She was discharged home today, which will be her second postoperative day.  She is afebrile with stable vital signs.  Abdomen is soft.  Bowel sounds are active. Incisions are clear.  She is passing flatus and voiding without difficulty.  Postop hemoglobin is 10.4.  In terms of complication, none were encountered during her stay in the hospital.  The patient was discharged home in stable condition.  DISPOSITION:  The patient is to avoid heavy lifting or driving a car. She is instructed to call should there be fever, nausea, vomiting, excessive pain, or signs and symptoms of deep venous thrombosis or pulmonary embolus.  Discharged home on Percocet as needed for pain. Plan followup in the office in 1 week.     Darlyn Chamber, M.D.     JSM/MEDQ  D:  05/10/2016  T:  05/10/2016  Job:  [id number]

## 2016-08-31 NOTE — Addendum Note (Signed)
Addendum  created 08/31/16 1002 by Lyn Hollingshead, MD   Sign clinical note

## 2017-01-24 ENCOUNTER — Ambulatory Visit: Payer: Non-veteran care | Attending: *Deleted | Admitting: Physical Therapy

## 2017-01-24 ENCOUNTER — Encounter: Payer: Self-pay | Admitting: Physical Therapy

## 2017-01-24 DIAGNOSIS — M25671 Stiffness of right ankle, not elsewhere classified: Secondary | ICD-10-CM | POA: Diagnosis present

## 2017-01-24 DIAGNOSIS — R262 Difficulty in walking, not elsewhere classified: Secondary | ICD-10-CM | POA: Diagnosis present

## 2017-01-24 DIAGNOSIS — M25571 Pain in right ankle and joints of right foot: Secondary | ICD-10-CM | POA: Insufficient documentation

## 2017-01-25 ENCOUNTER — Encounter: Payer: Self-pay | Admitting: Physical Therapy

## 2017-01-28 NOTE — Therapy (Signed)
[place], [place], [address] Phone: [phone]   Fax:  [phone]  Physical Therapy Evaluation  Patient Details  Name: Kristen Heath MRN: [id number] Date of Birth: 09/26/1965 Referring Provider: Nelta Numbers NP    Encounter Date: 01/24/2017  PT End of Session - 01/28/17 0757    Visit Number  1    Number of Visits  12    Date for PT Re-Evaluation  03/08/17    Authorization Type  [facility] choice 12 visits approved     PT Start Time  1633    PT Stop Time  1716    PT Time Calculation (min)  43 min    Activity Tolerance  Patient tolerated treatment well    Behavior During Therapy  [facility] for tasks assessed/performed       Past Medical History:  Diagnosis Date  . Anemia   . Anxiety    no current meds  . Arthritis    knees  . Asthma    cough induced asthma  . CPAP (continuous positive airway pressure) dependence   . Dry skin   . Gestational diabetes   . Headache(784.0)    Migraines  . History of kidney stones    no surgery - passed stone  . Hyperlipidemia   . Hypertension   . Pre-diabetes   . PTSD (post-traumatic stress disorder)   . Seasonal allergies   . Sleep apnea    recent diagnosis 01/18/13     Past Surgical History:  Procedure Laterality Date  . arthroscopic right knee     . CESAREAN SECTION  1993  . COLONOSCOPY    . MYOMECTOMY ABDOMINAL APPROACH    . WISDOM TOOTH EXTRACTION      There were no vitals filed for this visit.   Subjective Assessment - 01/28/17 0754    Subjective  Patient has had pain in her ankle and the bottom of her foot. The pain in her ankle started after a physical fittness test in August. She has had pain in the bootom of her foot sice 2014. She is currently in a CAM walker boot and has been  for 2 months. She can walk better in the boot.     Limitations  Sitting;Standing;Walking    Diagnostic tests  Nothing in the chart     Patient Stated Goals  to  have less pain     Currently in Pain?  Yes    Pain Score  7     Pain Location  Ankle    Pain Orientation  Lower    Pain Descriptors / Indicators  Aching    Pain Type  Chronic pain    Pain Onset  More than a month ago    Pain Frequency  Constant    Aggravating Factors   standing, walking     Pain Relieving Factors  changing position     Effect of Pain on Daily Activities  difficulty perfroming daily taks     Multiple Pain Sites  Yes    Pain Score  4    Pain Location  Foot    Pain Orientation  Lower;Mid    Pain Descriptors / Indicators  Aching    Pain Type  Chronic pain    Pain Onset  More than a month ago    Pain Frequency  Constant    Aggravating Factors   standing and walking     Pain Relieving Factors  rest  Effect of Pain on Daily Activities  difficulty walking distances                  Objective measurements completed on examination: See above findings.                PT Short Term Goals - 01/28/17 0757      PT SHORT TERM GOAL #1   Title  Patient will increase passive right dorsi-flexion to neutral     Time  4    Period  Weeks    Status  New      PT SHORT TERM GOAL #2   Title  Patient will increase gross right ankle strength to 4/5     Time  4    Period  Weeks    Status  New      PT SHORT TERM GOAL #3   Title  Patient will ambualte 200' without the boot with equal single leg stance and no increase in pain     Time  4    Period  Weeks    Status  New      PT SHORT TERM GOAL #4   Title  Patient will be independent with HEP     Time  4    Period  Weeks    Status  New        PT Long Term Goals - 01/28/17 0757      PT LONG TERM GOAL #1   Title  Patient will stand for 1 hour without pain in ordetr to perfrom work tasks     Time  6    Period  Weeks    Status  New      PT LONG TERM GOAL #2   Title  Patient will ambaulte 3000' without the boot and without self reported increase in pain in order to perfrom daily tasks     Time  6     Period  Weeks    Status  New      PT LONG TERM GOAL #3   Title  Patient will demsotrate a 39% limitation with her FOTO score    Time  6    Period  Weeks    Status  New             Plan - 01/28/17 0757    Clinical Impression Statement  Patient is a 51 year old female with left posterior lateral ankle pain and foot pain. Sings and symptoms are consitent with peroneal tendinits and plantar faciitis. She presents with significant limitations in strength; single leg stability; and right ankle dorsi flexion. She has increased pain when standing and walking. she has been in a boot for 2 months. she also has some weakness in her right hip which may be contributing to her pain.     Rehab Potential  Good    PT Frequency  2x / week    PT Duration  6 weeks    PT Treatment/Interventions  ADLs/Self Care Home Management;Cryotherapy;Electrical Stimulation;Iontophoresis 4mg /ml Dexamethasone;Stair training;Gait training;Therapeutic activities;Therapeutic exercise;Neuromuscular re-education;Patient/family education;Passive range of motion;Manual techniques;Taping;Traction;Moist Heat;Ultrasound    PT Next Visit Plan  manual therapy to improve DF; add seated windshield wiper; seated heel raise; towel scruntch; Consider ustraspiund to peroneal tendons; consider taping     PT Home Exercise Plan  ankle 4-way t-band; DF stretch    Consulted and Agree with Plan of Care  Patient       Patient will benefit from skilled therapeutic  intervention in order to improve the following deficits and impairments:  Abnormal gait, Decreased activity tolerance, Decreased strength, Pain, Difficulty walking, Decreased range of motion  Visit Diagnosis: Pain in right ankle and joints of right foot - Plan: PT plan of care cert/re-cert  Stiffness of right ankle, not elsewhere classified - Plan: PT plan of care cert/re-cert  Difficulty in walking, not elsewhere classified - Plan: PT plan of care cert/re-cert     Problem  List Patient Active Problem List   Diagnosis Date Noted  . S/P bilateral salpingo-oophorectomy 05/08/2016  . Pelvic pain 02/10/2013    Class: Hospitalized for  . Abnormal uterine bleeding 02/10/2013    Class: Hospitalized for  . Status post abdominal hysterectomy 02/10/2013    Class: Status post    Carney Living  PT DPT  01/28/2017, 7:59 AM  [facility] [address] [place], [place], [address] Phone: [phone]   Fax:  [phone]  Name: Kristen Heath MRN: [id number] Date of Birth: April 30, 1965

## 2017-01-31 ENCOUNTER — Ambulatory Visit: Payer: Non-veteran care | Admitting: Physical Therapy

## 2017-01-31 DIAGNOSIS — M25571 Pain in right ankle and joints of right foot: Secondary | ICD-10-CM | POA: Diagnosis not present

## 2017-01-31 DIAGNOSIS — R262 Difficulty in walking, not elsewhere classified: Secondary | ICD-10-CM

## 2017-01-31 DIAGNOSIS — M25671 Stiffness of right ankle, not elsewhere classified: Secondary | ICD-10-CM

## 2017-02-01 ENCOUNTER — Encounter: Payer: Self-pay | Admitting: Physical Therapy

## 2017-02-01 NOTE — Therapy (Signed)
[place], [place], [address] Phone: [phone]   Fax:  [phone]  Physical Therapy Treatment  Patient Details  Name: Kristen Heath MRN: [id number] Date of Birth: 1965/07/11 Referring Provider: Nelta Numbers NP    Encounter Date: 01/31/2017  PT End of Session - 02/01/17 1235    Visit Number  2    Number of Visits  12    Date for PT Re-Evaluation  03/08/17    Authorization Type  [facility] choice 12 visits approved     PT Start Time  1630    PT Stop Time  1716    PT Time Calculation (min)  46 min    Activity Tolerance  Patient tolerated treatment well    Behavior During Therapy  [facility] for tasks assessed/performed       Past Medical History:  Diagnosis Date  . Anemia   . Anxiety    no current meds  . Arthritis    knees  . Asthma    cough induced asthma  . CPAP (continuous positive airway pressure) dependence   . Dry skin   . Gestational diabetes   . Headache(784.0)    Migraines  . History of kidney stones    no surgery - passed stone  . Hyperlipidemia   . Hypertension   . Pre-diabetes   . PTSD (post-traumatic stress disorder)   . Seasonal allergies   . Sleep apnea    recent diagnosis 01/18/13     Past Surgical History:  Procedure Laterality Date  . arthroscopic right knee     . CESAREAN SECTION  1993  . COLONOSCOPY    . MYOMECTOMY ABDOMINAL APPROACH    . WISDOM TOOTH EXTRACTION      There were no vitals filed for this visit.  Subjective Assessment - 02/01/17 1054    Subjective  Patient tolerated ambualtion well. her ankle mpotion has improved. she was sore over the weekend but she has not done any activity.     Limitations  Sitting;Standing;Walking    Diagnostic tests  Nothing in the chart     Patient Stated Goals  to have less pain     Currently in Pain?  Yes    Pain Score  5     Pain Location  Ankle    Pain Orientation  Lower    Pain Descriptors / Indicators  Aching     Pain Type  Chronic pain    Pain Onset  More than a month ago    Pain Frequency  Constant    Aggravating Factors   standing and walking     Pain Relieving Factors  vchanging positions     Effect of Pain on Daily Activities  difficulty perfroming daily tasks.                       [place] Adult PT Treatment/Exercise - 02/01/17 0001      Ambulation/Gait   Gait Comments  Patients ankle to neutral so therapy started gait training without the boot. The patient was tenative at first but as she walked more her single leg stance time improved.       Manual Therapy   Manual Therapy  Passive ROM;Soft tissue mobilization;Joint mobilization    Joint Mobilization  Anterior drawer and sub talor glides     Soft tissue mobilization  trigger point release to anterior tib;    Passive ROM  DF strethcing  Ankle Exercises: Stretches   Gastroc Stretch Limitations  with strap on table       Ankle Exercises: Supine   T-Band  3 way no IR red              PT Education - 02/01/17 1235    Education provided  Yes    Education Details  reviewed symptom mangement; improtance of HEP    Person(s) Educated  Patient    Methods  Explanation    Comprehension  Verbalized understanding;Returned demonstration;Verbal cues required;Tactile cues required       PT Short Term Goals - 01/28/17 0757      PT SHORT TERM GOAL #1   Title  Patient will increase passive right dorsi-flexion to neutral     Time  4    Period  Weeks    Status  New      PT SHORT TERM GOAL #2   Title  Patient will increase gross right ankle strength to 4/5     Time  4    Period  Weeks    Status  New      PT SHORT TERM GOAL #3   Title  Patient will ambualte 200' without the boot with equal single leg stance and no increase in pain     Time  4    Period  Weeks    Status  New      PT SHORT TERM GOAL #4   Title  Patient will be independent with HEP     Time  4    Period  Weeks    Status  New        PT  Long Term Goals - 01/28/17 0757      PT LONG TERM GOAL #1   Title  Patient will stand for 1 hour without pain in ordetr to perfrom work tasks     Time  6    Period  Weeks    Status  New      PT LONG TERM GOAL #2   Title  Patient will ambaulte 3000' without the boot and without self reported increase in pain in order to perfrom daily tasks     Time  6    Period  Weeks    Status  New      PT LONG TERM GOAL #3   Title  Patient will demsotrate a 39% limitation with her FOTO score    Time  6    Period  Weeks    Status  New            Plan - 02/01/17 1236    Clinical Impression Statement  Patient tolerated treatment well. she ambualted without the boot for 50' without increased pain. she was tenative at first but gained confidecne as she walked. She was advised to start out for about 1/2 an hour around the house. Therapy added ultrasound. She had no increase in pain after treatment.      PT Frequency  2x / week    PT Duration  6 weeks    PT Treatment/Interventions  ADLs/Self Care Home Management;Cryotherapy;Electrical Stimulation;Iontophoresis 4mg /ml Dexamethasone;Stair training;Gait training;Therapeutic activities;Therapeutic exercise;Neuromuscular re-education;Patient/family education;Passive range of motion;Manual techniques;Taping;Traction;Moist Heat;Ultrasound    PT Next Visit Plan  manual therapy to improve DF; add seated windshield wiper; seated heel raise; towel scruntch; Consider ustraspound to peroneal tendons; consider taping; TPDN to foot.     PT Home Exercise Plan  ankle 4-way t-band; DF stretch    Consulted  and Agree with Plan of Care  Patient       Patient will benefit from skilled therapeutic intervention in order to improve the following deficits and impairments:     Visit Diagnosis: Pain in right ankle and joints of right foot  Stiffness of right ankle, not elsewhere classified  Difficulty in walking, not elsewhere classified     Problem List Patient  Active Problem List   Diagnosis Date Noted  . S/P bilateral salpingo-oophorectomy 05/08/2016  . Pelvic pain 02/10/2013    Class: Hospitalized for  . Abnormal uterine bleeding 02/10/2013    Class: Hospitalized for  . Status post abdominal hysterectomy 02/10/2013    Class: Status post    Carney Living PT DPT  02/01/2017, 12:45 PM  [facility] [address] [place], [place], [address] Phone: [phone]   Fax:  [phone]  Name: Kristen Heath MRN: [id number] Date of Birth: [birth year]

## 2017-02-05 ENCOUNTER — Ambulatory Visit: Payer: Non-veteran care | Admitting: Physical Therapy

## 2017-02-05 ENCOUNTER — Encounter: Payer: Self-pay | Admitting: Physical Therapy

## 2017-02-05 DIAGNOSIS — M25571 Pain in right ankle and joints of right foot: Secondary | ICD-10-CM

## 2017-02-05 DIAGNOSIS — M25671 Stiffness of right ankle, not elsewhere classified: Secondary | ICD-10-CM

## 2017-02-05 DIAGNOSIS — R262 Difficulty in walking, not elsewhere classified: Secondary | ICD-10-CM

## 2017-02-05 NOTE — Therapy (Signed)
[place], [place], [address] Phone: [phone]   Fax:  [phone]  Physical Therapy Treatment  Patient Details  Name: Kristen Heath MRN: [id number] Date of Birth: September 21, 1965 Referring Provider: Nelta Numbers NP    Encounter Date: 02/05/2017  PT End of Session - 02/05/17 1633    Visit Number  3    Number of Visits  12    Date for PT Re-Evaluation  03/08/17    Authorization Type  [facility] choice 12 visits approved     PT Start Time  1633    PT Stop Time  1715    PT Time Calculation (min)  42 min    Activity Tolerance  Patient tolerated treatment well    Behavior During Therapy  [facility] for tasks assessed/performed       Past Medical History:  Diagnosis Date  . Anemia   . Anxiety    no current meds  . Arthritis    knees  . Asthma    cough induced asthma  . CPAP (continuous positive airway pressure) dependence   . Dry skin   . Gestational diabetes   . Headache(784.0)    Migraines  . History of kidney stones    no surgery - passed stone  . Hyperlipidemia   . Hypertension   . Pre-diabetes   . PTSD (post-traumatic stress disorder)   . Seasonal allergies   . Sleep apnea    recent diagnosis 01/18/13     Past Surgical History:  Procedure Laterality Date  . arthroscopic right knee     . CESAREAN SECTION  1993  . COLONOSCOPY    . MYOMECTOMY ABDOMINAL APPROACH    . WISDOM TOOTH EXTRACTION      There were no vitals filed for this visit.  Subjective Assessment - 02/05/17 1633    Subjective  Ankle is feeling better. Plantar fascia was hurting.     Patient Stated Goals  to have less pain     Currently in Pain?  Yes    Pain Score  6     Pain Location  Foot    Pain Orientation  Other (Comment) plantar aspect    Pain Descriptors / Indicators  Aching    Aggravating Factors   walking    Pain Relieving Factors  ice                      [facility] Adult PT Treatment/Exercise -  02/05/17 0001      Exercises   Exercises  Ankle      Modalities   Modalities  Ultrasound      Ultrasound   Ultrasound Location  Rt peroneal tendon    Ultrasound Parameters  8 min pulsed 1.0 w/cm2    Ultrasound Goals  Pain      Manual Therapy   Joint Mobilization  talocrural distraction      Ankle Exercises: Stretches   Other Stretch  seated hip ER stretch      Ankle Exercises: Standing   Other Standing Ankle Exercises  lateral & A/P weight shift      Ankle Exercises: Seated   Towel Crunch Limitations  seated EOB    Toe Raise Limitations  toe yoga    Other Seated Ankle Exercises  rythmic stabilizations    Other Seated Ankle Exercises  rocker board a/p & lat      Ankle Exercises: Supine   Isometrics  inversion, eversion  PT Short Term Goals - 01/28/17 0757      PT SHORT TERM GOAL #1   Title  Patient will increase passive right dorsi-flexion to neutral     Time  4    Period  Weeks    Status  New      PT SHORT TERM GOAL #2   Title  Patient will increase gross right ankle strength to 4/5     Time  4    Period  Weeks    Status  New      PT SHORT TERM GOAL #3   Title  Patient will ambualte 200' without the boot with equal single leg stance and no increase in pain     Time  4    Period  Weeks    Status  New      PT SHORT TERM GOAL #4   Title  Patient will be independent with HEP     Time  4    Period  Weeks    Status  New        PT Long Term Goals - 01/28/17 0757      PT LONG TERM GOAL #1   Title  Patient will stand for 1 hour without pain in ordetr to perfrom work tasks     Time  6    Period  Weeks    Status  New      PT LONG TERM GOAL #2   Title  Patient will ambaulte 3000' without the boot and without self reported increase in pain in order to perfrom daily tasks     Time  6    Period  Weeks    Status  New      PT LONG TERM GOAL #3   Title  Patient will demsotrate a 39% limitation with her FOTO score    Time  6    Period   Weeks    Status  New            Plan - 02/05/17 1718    Clinical Impression Statement  Pt able to demo good heel-toe gait pattern and weight shift without increased pain. Beginning to notice hip and LBP while walking in boot.     PT Treatment/Interventions  ADLs/Self Care Home Management;Cryotherapy;Electrical Stimulation;Iontophoresis 4mg /ml Dexamethasone;Stair training;Gait training;Therapeutic activities;Therapeutic exercise;Neuromuscular re-education;Patient/family education;Passive range of motion;Manual techniques;Taping;Traction;Moist Heat;Ultrasound    PT Next Visit Plan  manual therapy to improve DF; add seated windshield wiper; seated heel raise; consider taping; TPDN to foot.     PT Home Exercise Plan  ankle 4-way t-band; DF stretch, towel scrunches, toe yoga       Patient will benefit from skilled therapeutic intervention in order to improve the following deficits and impairments:  Abnormal gait, Decreased activity tolerance, Decreased strength, Pain, Difficulty walking, Decreased range of motion  Visit Diagnosis: Pain in right ankle and joints of right foot  Stiffness of right ankle, not elsewhere classified  Difficulty in walking, not elsewhere classified     Problem List Patient Active Problem List   Diagnosis Date Noted  . S/P bilateral salpingo-oophorectomy 05/08/2016  . Pelvic pain 02/10/2013    Class: Hospitalized for  . Abnormal uterine bleeding 02/10/2013    Class: Hospitalized for  . Status post abdominal hysterectomy 02/10/2013    Class: Status post    Andreana Klingerman C. Raney Antwine PT, DPT 02/05/17 5:23 PM   [facility] [address] [place], [place], [address] Phone: [phone]  Fax:  [phone]  Name: Kristen Heath MRN: [id number] Date of Birth: Sep 04, 1965

## 2017-02-07 ENCOUNTER — Ambulatory Visit: Payer: Non-veteran care | Admitting: Physical Therapy

## 2017-02-11 ENCOUNTER — Ambulatory Visit: Payer: Non-veteran care | Admitting: Physical Therapy

## 2017-02-11 DIAGNOSIS — M25571 Pain in right ankle and joints of right foot: Secondary | ICD-10-CM | POA: Diagnosis not present

## 2017-02-11 DIAGNOSIS — R262 Difficulty in walking, not elsewhere classified: Secondary | ICD-10-CM

## 2017-02-11 DIAGNOSIS — M25671 Stiffness of right ankle, not elsewhere classified: Secondary | ICD-10-CM

## 2017-02-12 NOTE — Therapy (Signed)
[place], [place], [address] Phone: [phone]   Fax:  [phone]  Physical Therapy Treatment  Patient Details  Name: Kristen Heath MRN: [id number] Date of Birth: 06/01/1965 Referring Provider: Nelta Numbers NP    Encounter Date: 02/11/2017  PT End of Session - 02/12/17 1230    Visit Number  4    Number of Visits  12    Date for PT Re-Evaluation  03/08/17    Authorization Type  VA choice 12 visits approved     PT Start Time  1634    PT Stop Time  1696    PT Time Calculation (min)  41 min    Activity Tolerance  Patient tolerated treatment well    Behavior During Therapy  [facility] for tasks assessed/performed       Past Medical History:  Diagnosis Date  . Anemia   . Anxiety    no current meds  . Arthritis    knees  . Asthma    cough induced asthma  . CPAP (continuous positive airway pressure) dependence   . Dry skin   . Gestational diabetes   . Headache(784.0)    Migraines  . History of kidney stones    no surgery - passed stone  . Hyperlipidemia   . Hypertension   . Pre-diabetes   . PTSD (post-traumatic stress disorder)   . Seasonal allergies   . Sleep apnea    recent diagnosis 01/18/13     Past Surgical History:  Procedure Laterality Date  . ABDOMINAL HYSTERECTOMY N/A 02/09/2013   Procedure: HYSTERECTOMY ABDOMINAL;  Surgeon: Darlyn Chamber, MD;  Location: [facility];  Service: Gynecology;  Laterality: N/A;  . arthroscopic right knee     . CESAREAN SECTION  1993  . COLONOSCOPY    . CYSTOSCOPY N/A 05/08/2016   Procedure: CYSTOSCOPY;  Surgeon: Arvella Nigh, MD;  Location: [facility];  Service: Gynecology;  Laterality: N/A;  . LAPAROSCOPIC SALPINGO OOPHERECTOMY Bilateral 05/08/2016   Procedure: ATTEMPTED LAPAROSCOPIC SALPINGO OOPHORECTOMY;  Surgeon: Arvella Nigh, MD;  Location: [facility];  Service: Gynecology;  Laterality: Bilateral;  Glendon Axe RNFA confirmed 04/16/16  . MYOMECTOMY ABDOMINAL  APPROACH    . SALPINGOOPHORECTOMY  05/08/2016   Procedure: BILATERAL SALPINGO OOPHORECTOMY,OPEN;  Surgeon: Arvella Nigh, MD;  Location: [facility];  Service: Gynecology;;  . UNILATERAL SALPINGECTOMY Right 02/09/2013   Procedure: UNILATERAL SALPINGECTOMY;  Surgeon: Darlyn Chamber, MD;  Location: [facility];  Service: Gynecology;  Laterality: Right;  . WISDOM TOOTH EXTRACTION      There were no vitals filed for this visit.  Subjective Assessment - 02/12/17 1214    Subjective  Patient continues to have plntar facia pain but she reports that has improved. Her pain continues to be corelated to activity level and she has nbot been doing much lately     Limitations  Sitting;Standing;Walking    Diagnostic tests  Nothing in the chart     Patient Stated Goals  to have less pain     Currently in Pain?  Yes    Pain Score  6     Pain Location  Foot    Pain Orientation  Other (Comment)    Pain Descriptors / Indicators  Aching    Pain Type  Chronic pain    Pain Onset  More than a month ago    Pain Frequency  Constant    Aggravating Factors   walking     Pain Relieving Factors  ice    Effect of Pain on Daily Activities  difficulty perfroming ADL's     Multiple Pain Sites  No                      [facility] Adult PT Treatment/Exercise - 02/12/17 0001      Modalities   Modalities  Ultrasound      Ultrasound   Ultrasound Location  Rt Peroneal tendon     Ultrasound Parameters  8 min pulsed 1.0 w/cm2     Ultrasound Goals  Pain      Manual Therapy   Joint Mobilization  talocrural distraction    Soft tissue mobilization  trigger point release to anterior tib; gentle IASTM to plantar facia     Passive ROM  DF strethcing       Ankle Exercises: Standing   Other Standing Ankle Exercises  lateral & A/P weight shift in boot       Additional Ankle Exercises DO NOT USE   Towel Crunch Limitations  seated EOB      Ankle Exercises: Seated   Toe Raise Limitations  toe yoga    Other Seated Ankle  Exercises  rythmic stabilizations    Other Seated Ankle Exercises  rocker board a/p & lat      Ankle Exercises: Supine   Isometrics  inversion, eversion    T-Band  DF2x10 red PF 2x10 red              PT Education - 02/12/17 1230    Education provided  Yes    Education Details  technique with ther-ex     Person(s) Educated  Patient    Methods  Explanation    Comprehension  Verbalized understanding;Returned demonstration;Verbal cues required;Tactile cues required       PT Short Term Goals - 02/12/17 1323      PT SHORT TERM GOAL #1   Title  Patient will increase passive right dorsi-flexion to neutral     Time  4    Period  Weeks    Status  On-going      PT SHORT TERM GOAL #2   Title  Patient will increase gross right ankle strength to 4/5     Time  4    Status  On-going      PT SHORT TERM GOAL #3   Title  Patient will ambualte 200' without the boot with equal single leg stance and no increase in pain     Time  4    Period  Weeks    Status  On-going      PT SHORT TERM GOAL #4   Title  Patient will be independent with HEP     Time  4    Period  Weeks    Status  On-going        PT Long Term Goals - 01/28/17 0757      PT LONG TERM GOAL #1   Title  Patient will stand for 1 hour without pain in ordetr to perfrom work tasks     Time  6    Period  Weeks    Status  New      PT LONG TERM GOAL #2   Title  Patient will ambaulte 3000' without the boot and without self reported increase in pain in order to perfrom daily tasks     Time  6    Period  Weeks    Status  New  PT LONG TERM GOAL #3   Title  Patient will demsotrate a 39% limitation with her FOTO score    Time  6    Period  Weeks    Status  New            Plan - 02/12/17 1319    Clinical Impression Statement  Patient forgot her shoe so therapy focused on manual therapy and seaetd strengthening. She had a slight increase in heel pain after treatment.     Clinical Presentation  Evolving     Clinical Decision Making  Low    Rehab Potential  Good    PT Frequency  2x / week    PT Duration  6 weeks    PT Treatment/Interventions  ADLs/Self Care Home Management;Cryotherapy;Electrical Stimulation;Iontophoresis 4mg /ml Dexamethasone;Stair training;Gait training;Therapeutic activities;Therapeutic exercise;Neuromuscular re-education;Patient/family education;Passive range of motion;Manual techniques;Taping;Traction;Moist Heat;Ultrasound    PT Next Visit Plan  manual therapy to improve DF; add seated windshield wiper; seated heel raise; consider taping; TPDN to foot.     PT Home Exercise Plan  ankle 4-way t-band; DF stretch, towel scrunches, toe yoga    Consulted and Agree with Plan of Care  Patient       Patient will benefit from skilled therapeutic intervention in order to improve the following deficits and impairments:  Abnormal gait, Decreased activity tolerance, Decreased strength, Pain, Difficulty walking, Decreased range of motion  Visit Diagnosis: Pain in right ankle and joints of right foot  Stiffness of right ankle, not elsewhere classified  Difficulty in walking, not elsewhere classified     Problem List Patient Active Problem List   Diagnosis Date Noted  . S/P bilateral salpingo-oophorectomy 05/08/2016  . Pelvic pain 02/10/2013    Class: Hospitalized for  . Abnormal uterine bleeding 02/10/2013    Class: Hospitalized for  . Status post abdominal hysterectomy 02/10/2013    Class: Status post    Carney Living PT DPT  02/12/2017, 1:25 PM  [facility] [address] [place], [place], [address] Phone: [phone]   Fax:  [phone]  Name: Kristen Heath MRN: [id number] Date of Birth: 1965-06-30

## 2017-02-13 ENCOUNTER — Ambulatory Visit: Payer: Non-veteran care | Admitting: Physical Therapy

## 2017-02-13 ENCOUNTER — Encounter: Payer: Self-pay | Admitting: Physical Therapy

## 2017-02-13 DIAGNOSIS — R262 Difficulty in walking, not elsewhere classified: Secondary | ICD-10-CM

## 2017-02-13 DIAGNOSIS — M25571 Pain in right ankle and joints of right foot: Secondary | ICD-10-CM | POA: Diagnosis not present

## 2017-02-13 DIAGNOSIS — M25671 Stiffness of right ankle, not elsewhere classified: Secondary | ICD-10-CM

## 2017-02-13 NOTE — Therapy (Signed)
[place], [place], [address] Phone: [phone]   Fax:  [phone]  Physical Therapy Treatment  Patient Details  Name: Kristen Heath MRN: [id number] Date of Birth: 04-Feb-1966 Referring Provider: Nelta Numbers NP    Encounter Date: 02/13/2017  PT End of Session - 02/13/17 1645    Visit Number  5    Number of Visits  12    Date for PT Re-Evaluation  03/08/17    Authorization Type  VA choice 12 visits approved     PT Start Time  3557    PT Stop Time  1720    PT Time Calculation (min)  38 min    Activity Tolerance  Patient tolerated treatment well    Behavior During Therapy  [facility] for tasks assessed/performed       Past Medical History:  Diagnosis Date  . Anemia   . Anxiety    no current meds  . Arthritis    knees  . Asthma    cough induced asthma  . CPAP (continuous positive airway pressure) dependence   . Dry skin   . Gestational diabetes   . Headache(784.0)    Migraines  . History of kidney stones    no surgery - passed stone  . Hyperlipidemia   . Hypertension   . Pre-diabetes   . PTSD (post-traumatic stress disorder)   . Seasonal allergies   . Sleep apnea    recent diagnosis 01/18/13     Past Surgical History:  Procedure Laterality Date  . ABDOMINAL HYSTERECTOMY N/A 02/09/2013   Procedure: HYSTERECTOMY ABDOMINAL;  Surgeon: Darlyn Chamber, MD;  Location: [facility];  Service: Gynecology;  Laterality: N/A;  . arthroscopic right knee     . CESAREAN SECTION  1993  . COLONOSCOPY    . CYSTOSCOPY N/A 05/08/2016   Procedure: CYSTOSCOPY;  Surgeon: Arvella Nigh, MD;  Location: [facility];  Service: Gynecology;  Laterality: N/A;  . LAPAROSCOPIC SALPINGO OOPHERECTOMY Bilateral 05/08/2016   Procedure: ATTEMPTED LAPAROSCOPIC SALPINGO OOPHORECTOMY;  Surgeon: Arvella Nigh, MD;  Location: [facility];  Service: Gynecology;  Laterality: Bilateral;  Glendon Axe RNFA confirmed 04/16/16  . MYOMECTOMY ABDOMINAL  APPROACH    . SALPINGOOPHORECTOMY  05/08/2016   Procedure: BILATERAL SALPINGO OOPHORECTOMY,OPEN;  Surgeon: Arvella Nigh, MD;  Location: [facility];  Service: Gynecology;;  . UNILATERAL SALPINGECTOMY Right 02/09/2013   Procedure: UNILATERAL SALPINGECTOMY;  Surgeon: Darlyn Chamber, MD;  Location: [facility];  Service: Gynecology;  Laterality: Right;  . WISDOM TOOTH EXTRACTION      There were no vitals filed for this visit.  Subjective Assessment - 02/13/17 1644    Subjective  Feeling a little better today. most pain on lateral side of ankle    Patient Stated Goals  to have less pain     Currently in Pain?  Yes    Pain Score  6     Pain Location  Ankle    Pain Orientation  Right;Lateral                      [facility] Adult PT Treatment/Exercise - 02/13/17 0001      Ankle Exercises: Seated   Towel Inversion/Eversion  Other (comment) windshield wiper    Toe Raise Limitations  seated heel/toe raises    BAPS  Level 3      Ankle Exercises: Standing   Other Standing Ankle Exercises  heel-toe step onto airex & return    Other Standing  Ankle Exercises  gait training- heel-toe, step length/width      Ankle Exercises: Aerobic   Stationary Bike  nu step 5 min L5             PT Education - 02/12/17 1230    Education provided  Yes    Education Details  technique with ther-ex     Person(s) Educated  Patient    Methods  Explanation    Comprehension  Verbalized understanding;Returned demonstration;Verbal cues required;Tactile cues required       PT Short Term Goals - 02/12/17 1323      PT SHORT TERM GOAL #1   Title  Patient will increase passive right dorsi-flexion to neutral     Time  4    Period  Weeks    Status  On-going      PT SHORT TERM GOAL #2   Title  Patient will increase gross right ankle strength to 4/5     Time  4    Status  On-going      PT SHORT TERM GOAL #3   Title  Patient will ambualte 200' without the boot with equal single leg stance and no increase in  pain     Time  4    Period  Weeks    Status  On-going      PT SHORT TERM GOAL #4   Title  Patient will be independent with HEP     Time  4    Period  Weeks    Status  On-going        PT Long Term Goals - 01/28/17 0757      PT LONG TERM GOAL #1   Title  Patient will stand for 1 hour without pain in ordetr to perfrom work tasks     Time  6    Period  Weeks    Status  New      PT LONG TERM GOAL #2   Title  Patient will ambaulte 3000' without the boot and without self reported increase in pain in order to perfrom daily tasks     Time  6    Period  Weeks    Status  New      PT LONG TERM GOAL #3   Title  Patient will demsotrate a 39% limitation with her FOTO score    Time  6    Period  Weeks    Status  New            Plan - 02/13/17 1714    Clinical Impression Statement  Pt reported improvement in lateral foot pain following treatment. Notable lack of control on BAPS board and in windshield wiper exercise. Able to demo good gait pattern slowly and with VC.     PT Treatment/Interventions  ADLs/Self Care Home Management;Cryotherapy;Electrical Stimulation;Iontophoresis 4mg /ml Dexamethasone;Stair training;Gait training;Therapeutic activities;Therapeutic exercise;Neuromuscular re-education;Patient/family education;Passive range of motion;Manual techniques;Taping;Traction;Moist Heat;Ultrasound    PT Next Visit Plan  manual therapy PRN, consider TPDN PRN, gait pattern, control on unstable surfaces, wean from boot to ASO    PT Home Exercise Plan  ankle 4-way t-band; DF Heath, towel scrunches, toe yoga, seated heel/toe raises, windshield wiper       Patient will benefit from skilled therapeutic intervention in order to improve the following deficits and impairments:  Abnormal gait, Decreased activity tolerance, Decreased strength, Pain, Difficulty walking, Decreased range of motion  Visit Diagnosis: Pain in right ankle and joints of right foot  Stiffness of right ankle,  not  elsewhere classified  Difficulty in walking, not elsewhere classified     Problem List Patient Active Problem List   Diagnosis Date Noted  . S/P bilateral salpingo-oophorectomy 05/08/2016  . Pelvic pain 02/10/2013    Class: Hospitalized for  . Abnormal uterine bleeding 02/10/2013    Class: Hospitalized for  . Status post abdominal hysterectomy 02/10/2013    Class: Status post    Janalee Grobe C. Kailee Essman PT, DPT 02/13/17 5:23 PM   [facility] [address] [place], [place], [address] Phone: [phone]   Fax:  [phone]  Name: Kristen Heath MRN: [id number] Date of Birth: Sep 20, 1965

## 2017-02-19 ENCOUNTER — Encounter: Payer: Federal, State, Local not specified - PPO | Admitting: Physical Therapy

## 2017-02-21 ENCOUNTER — Ambulatory Visit: Payer: Non-veteran care | Admitting: Physical Therapy

## 2017-02-21 ENCOUNTER — Encounter: Payer: Self-pay | Admitting: Physical Therapy

## 2017-02-21 DIAGNOSIS — M25571 Pain in right ankle and joints of right foot: Secondary | ICD-10-CM | POA: Diagnosis not present

## 2017-02-21 DIAGNOSIS — M25671 Stiffness of right ankle, not elsewhere classified: Secondary | ICD-10-CM

## 2017-02-21 DIAGNOSIS — R262 Difficulty in walking, not elsewhere classified: Secondary | ICD-10-CM

## 2017-02-21 NOTE — Therapy (Signed)
[place], [place], [address] Phone: [phone]   Fax:  [phone]  Physical Therapy Treatment  Patient Details  Name: Kristen Heath MRN: [id number] Date of Birth: 03-Apr-1965 Referring Provider: Nelta Numbers NP    Encounter Date: 02/21/2017  PT End of Session - 02/21/17 1631    Visit Number  6    Number of Visits  12    Date for PT Re-Evaluation  03/08/17    Authorization Type  VA choice 12 visits approved     PT Start Time  1628    PT Stop Time  1712    PT Time Calculation (min)  44 min    Activity Tolerance  Patient tolerated treatment well    Behavior During Therapy  [facility] for tasks assessed/performed       Past Medical History:  Diagnosis Date  . Anemia   . Anxiety    no current meds  . Arthritis    knees  . Asthma    cough induced asthma  . CPAP (continuous positive airway pressure) dependence   . Dry skin   . Gestational diabetes   . Headache(784.0)    Migraines  . History of kidney stones    no surgery - passed stone  . Hyperlipidemia   . Hypertension   . Pre-diabetes   . PTSD (post-traumatic stress disorder)   . Seasonal allergies   . Sleep apnea    recent diagnosis 01/18/13     Past Surgical History:  Procedure Laterality Date  . ABDOMINAL HYSTERECTOMY N/A 02/09/2013   Procedure: HYSTERECTOMY ABDOMINAL;  Surgeon: Darlyn Chamber, MD;  Location: [facility];  Service: Gynecology;  Laterality: N/A;  . arthroscopic right knee     . CESAREAN SECTION  1993  . COLONOSCOPY    . CYSTOSCOPY N/A 05/08/2016   Procedure: CYSTOSCOPY;  Surgeon: Arvella Nigh, MD;  Location: [facility];  Service: Gynecology;  Laterality: N/A;  . LAPAROSCOPIC SALPINGO OOPHERECTOMY Bilateral 05/08/2016   Procedure: ATTEMPTED LAPAROSCOPIC SALPINGO OOPHORECTOMY;  Surgeon: Arvella Nigh, MD;  Location: [facility];  Service: Gynecology;  Laterality: Bilateral;  Glendon Axe RNFA confirmed 04/16/16  . MYOMECTOMY ABDOMINAL  APPROACH    . SALPINGOOPHORECTOMY  05/08/2016   Procedure: BILATERAL SALPINGO OOPHORECTOMY,OPEN;  Surgeon: Arvella Nigh, MD;  Location: [facility];  Service: Gynecology;;  . UNILATERAL SALPINGECTOMY Right 02/09/2013   Procedure: UNILATERAL SALPINGECTOMY;  Surgeon: Darlyn Chamber, MD;  Location: [facility];  Service: Gynecology;  Laterality: Right;  . WISDOM TOOTH EXTRACTION      There were no vitals filed for this visit.  Subjective Assessment - 02/21/17 1632    Subjective  Feeling good because I got 3 days of rest. Feels like lateral ankle is a little better. Bottom of heel hurts with weight bearing.     Patient Stated Goals  to have less pain     Currently in Pain?  Yes    Pain Score  6     Pain Location  Heel    Pain Orientation  Right                      [facility] Adult PT Treatment/Exercise - 02/21/17 0001      Ultrasound   Ultrasound Location  distal Rt achilles tendon    Ultrasound Parameters  8 min pulsed 1.0 w/cm2    Ultrasound Goals  Pain      Ankle Exercises: Aerobic   Stationary Bike  nu  step 5 min L4      Ankle Exercises: Stretches   Gastroc Stretch  2 reps;30 seconds    Gastroc Stretch Limitations  long sitting gastroc stretch with strap      Ankle Exercises: Seated   Toe Raise Limitations  heel/toe raises    BAPS  Level 3    Other Seated Ankle Exercises  eversion red tband    Other Seated Ankle Exercises  AROM DF; 4-way AROM      Ankle Exercises: Standing   Other Standing Ankle Exercises  2" step up             PT Education - 02/21/17 1718    Education provided  Yes    Education Details  ultrasound, weening from boot, exercise form/rationale    Person(s) Educated  Patient    Methods  Explanation;Demonstration;Tactile cues;Verbal cues    Comprehension  Verbalized understanding;Need further instruction;Returned demonstration;Verbal cues required;Tactile cues required       PT Short Term Goals - 02/12/17 1323      PT SHORT TERM GOAL #1    Title  Patient will increase passive right dorsi-flexion to neutral     Time  4    Period  Weeks    Status  On-going      PT SHORT TERM GOAL #2   Title  Patient will increase gross right ankle strength to 4/5     Time  4    Status  On-going      PT SHORT TERM GOAL #3   Title  Patient will ambualte 200' without the boot with equal single leg stance and no increase in pain     Time  4    Period  Weeks    Status  On-going      PT SHORT TERM GOAL #4   Title  Patient will be independent with HEP     Time  4    Period  Weeks    Status  On-going        PT Long Term Goals - 01/28/17 0757      PT LONG TERM GOAL #1   Title  Patient will stand for 1 hour without pain in ordetr to perfrom work tasks     Time  6    Period  Weeks    Status  New      PT LONG TERM GOAL #2   Title  Patient will ambaulte 3000' without the boot and without self reported increase in pain in order to perfrom daily tasks     Time  6    Period  Weeks    Status  New      PT LONG TERM GOAL #3   Title  Patient will demsotrate a 39% limitation with her FOTO score    Time  6    Period  Weeks    Status  New            Plan - 02/21/17 1714    Clinical Impression Statement  Pt reported decreased pain in WB following [place] today. Able to demo good gait pattern with mild supination at heel strike. Asked pt to begin out of boot for 1 hour at night in tennis shoes for 4 days and then progress to two hours if pain levels remain low.     PT Treatment/Interventions  ADLs/Self Care Home Management;Cryotherapy;Electrical Stimulation;Iontophoresis 4mg /ml Dexamethasone;Stair training;Gait training;Therapeutic activities;Therapeutic exercise;Neuromuscular re-education;Patient/family education;Passive range of motion;Manual techniques;Taping;Traction;Moist Heat;Ultrasound    PT Next  Visit Plan  [place] PRN, gait training, stairs, unstable surfaces    PT Home Exercise Plan  ankle 4-way t-band; DF stretch, towel scrunches, toe  yoga, seated heel/toe raises, windshield wiper; 4-way AROM    Consulted and Agree with Plan of Care  Patient       Patient will benefit from skilled therapeutic intervention in order to improve the following deficits and impairments:  Abnormal gait, Decreased activity tolerance, Decreased strength, Pain, Difficulty walking, Decreased range of motion  Visit Diagnosis: Pain in right ankle and joints of right foot  Stiffness of right ankle, not elsewhere classified  Difficulty in walking, not elsewhere classified     Problem List Patient Active Problem List   Diagnosis Date Noted  . S/P bilateral salpingo-oophorectomy 05/08/2016  . Pelvic pain 02/10/2013    Class: Hospitalized for  . Abnormal uterine bleeding 02/10/2013    Class: Hospitalized for  . Status post abdominal hysterectomy 02/10/2013    Class: Status post    Matraca Hunkins C. Faithlyn Recktenwald PT, DPT 02/21/17 5:19 PM   [facility] [address] [place], [place], [address] Phone: [phone]   Fax:  [phone]  Name: KASSADIE PANCAKE MRN: [id number] Date of Birth: [birth year]

## 2017-02-26 ENCOUNTER — Ambulatory Visit: Payer: Non-veteran care | Attending: *Deleted | Admitting: Physical Therapy

## 2017-02-26 DIAGNOSIS — M25571 Pain in right ankle and joints of right foot: Secondary | ICD-10-CM | POA: Diagnosis present

## 2017-02-26 DIAGNOSIS — M25671 Stiffness of right ankle, not elsewhere classified: Secondary | ICD-10-CM | POA: Insufficient documentation

## 2017-02-26 DIAGNOSIS — R262 Difficulty in walking, not elsewhere classified: Secondary | ICD-10-CM | POA: Insufficient documentation

## 2017-02-27 NOTE — Therapy (Signed)
[place], [place], [address] Phone: [phone]   Fax:  [phone]  Physical Therapy Treatment  Patient Details  Name: Kristen Heath MRN: [id number] Date of Birth: [birth year] Referring Provider: Nelta Numbers NP    Encounter Date: 02/26/2017  PT End of Session - 02/27/17 0757    Visit Number  7    Number of Visits  12    Date for PT Re-Evaluation  03/08/17    Authorization Type  VA choice 12 visits approved     PT Start Time  1630    PT Stop Time  1714    PT Time Calculation (min)  44 min    Activity Tolerance  Patient tolerated treatment well    Behavior During Therapy  [facility] for tasks assessed/performed       Past Medical History:  Diagnosis Date  . Anemia   . Anxiety    no current meds  . Arthritis    knees  . Asthma    cough induced asthma  . CPAP (continuous positive airway pressure) dependence   . Dry skin   . Gestational diabetes   . Headache(784.0)    Migraines  . History of kidney stones    no surgery - passed stone  . Hyperlipidemia   . Hypertension   . Pre-diabetes   . PTSD (post-traumatic stress disorder)   . Seasonal allergies   . Sleep apnea    recent diagnosis 01/18/13     Past Surgical History:  Procedure Laterality Date  . ABDOMINAL HYSTERECTOMY N/A 02/09/2013   Procedure: HYSTERECTOMY ABDOMINAL;  Surgeon: Darlyn Chamber, MD;  Location: [facility];  Service: Gynecology;  Laterality: N/A;  . arthroscopic right knee     . CESAREAN SECTION  1993  . COLONOSCOPY    . CYSTOSCOPY N/A 05/08/2016   Procedure: CYSTOSCOPY;  Surgeon: Arvella Nigh, MD;  Location: [facility];  Service: Gynecology;  Laterality: N/A;  . LAPAROSCOPIC SALPINGO OOPHERECTOMY Bilateral 05/08/2016   Procedure: ATTEMPTED LAPAROSCOPIC SALPINGO OOPHORECTOMY;  Surgeon: Arvella Nigh, MD;  Location: [facility];  Service: Gynecology;  Laterality: Bilateral;  Glendon Axe RNFA confirmed 04/16/16  . MYOMECTOMY ABDOMINAL  APPROACH    . SALPINGOOPHORECTOMY  05/08/2016   Procedure: BILATERAL SALPINGO OOPHORECTOMY,OPEN;  Surgeon: Arvella Nigh, MD;  Location: [facility];  Service: Gynecology;;  . UNILATERAL SALPINGECTOMY Right 02/09/2013   Procedure: UNILATERAL SALPINGECTOMY;  Surgeon: Darlyn Chamber, MD;  Location: [facility];  Service: Gynecology;  Laterality: Right;  . WISDOM TOOTH EXTRACTION      There were no vitals filed for this visit.  Subjective Assessment - 02/26/17 1640    Subjective  Patient reports her ankle pain is about a 5/10 today. She has been walking about an hour at a time in the house without the boot. She feels like the pump in the boot is not working.     Currently in Pain?  Yes    Pain Score  4     Pain Location  Ankle    Pain Orientation  Right    Pain Descriptors / Indicators  Aching    Pain Type  Chronic pain    Pain Onset  More than a month ago    Pain Frequency  Constant    Aggravating Factors   walking     Pain Relieving Factors  ice     Effect of Pain on Daily Activities  difficulty perfroming     Multiple Pain Sites  No  Pain Score  4    Pain Location  Foot    Pain Orientation  Lower;Mid    Pain Descriptors / Indicators  Aching    Pain Type  Chronic pain    Pain Onset  More than a month ago    Pain Frequency  Constant    Aggravating Factors   standing and walking;     Pain Relieving Factors  rest     Effect of Pain on Daily Activities  difficulty walking distances                       [facility] Adult PT Treatment/Exercise - 02/27/17 0001      Manual Therapy   Joint Mobilization  talocrural distraction    Soft tissue mobilization  trigger point release to anterior tib and medial gastroc; gentle IASTM to plantar facia and to medial gastroc       Ankle Exercises: Standing   Heel Raises  20 reps in limited range 2x10     Other Standing Ankle Exercises  2" step up 2x10; standing march 2x10 with cuing to go slow       Ankle Exercises: Aerobic   Stationary Bike   nu step 5 min L4      Ankle Exercises: Stretches   Gastroc Stretch  2 reps;30 seconds    Gastroc Stretch Limitations  long sitting gastroc stretch with strap      Ankle Exercises: Seated   Other Seated Ankle Exercises  eversion red tband 2x10              PT Education - 02/26/17 1645    Education provided  Yes    Education Details  weeninf from the boot; exercise rationale;     Person(s) Educated  Patient    Methods  Explanation;Demonstration;Tactile cues    Comprehension  Verbalized understanding;Returned demonstration;Verbal cues required;Tactile cues required       PT Short Term Goals - 02/26/17 1650      PT SHORT TERM GOAL #1   Title  Patient will increase passive right dorsi-flexion to neutral   (Pended)     Time  4  (Pended)     Period  Weeks  (Pended)     Status  On-going  (Pended)       PT SHORT TERM GOAL #2   Title  Patient will increase gross right ankle strength to 4/5   (Pended)     Time  4  (Pended)     Period  Weeks  (Pended)     Status  On-going  (Pended)       PT SHORT TERM GOAL #3   Title  Patient will ambualte 200' without the boot with equal single leg stance and no increase in pain   (Pended)     Time  4  (Pended)     Period  Weeks  (Pended)     Status  On-going  (Pended)       PT SHORT TERM GOAL #4   Title  Patient will be independent with HEP   (Pended)     Time  4  (Pended)     Period  Weeks  (Pended)     Status  On-going  (Pended)         PT Long Term Goals - 01/28/17 0757      PT LONG TERM GOAL #1   Title  Patient will stand for 1 hour without pain in ordetr to perfrom  work tasks     Time  6    Period  Weeks    Status  New      PT LONG TERM GOAL #2   Title  Patient will ambaulte 3000' without the boot and without self reported increase in pain in order to perfrom daily tasks     Time  6    Period  Weeks    Status  New      PT LONG TERM GOAL #3   Title  Patient will demsotrate a 39% limitation with her FOTO score    Time   6    Period  Weeks    Status  New            Plan - 02/26/17 1648    Clinical Impression Statement  Therapy added standing exercises today. She did not report any increase in pain. She had significant triger points in her medial gastroc. She would benefit from [facility]. She was encouraged to spend more time out of the boot and to follow her symptoms.     Clinical Presentation  Evolving    Clinical Decision Making  Low    Rehab Potential  Good    PT Frequency  2x / week    PT Duration  6 weeks    PT Treatment/Interventions  ADLs/Self Care Home Management;Cryotherapy;Electrical Stimulation;Iontophoresis 4mg /ml Dexamethasone;Stair training;Gait training;Therapeutic activities;Therapeutic exercise;Neuromuscular re-education;Patient/family education;Passive range of motion;Manual techniques;Taping;Traction;Moist Heat;Ultrasound    PT Next Visit Plan  [place] PRN, gait training, stairs, unstable surfaces    PT Home Exercise Plan  ankle 4-way t-band; DF stretch, towel scrunches, toe yoga, seated heel/toe raises, windshield wiper; 4-way AROM    Consulted and Agree with Plan of Care  Patient       Patient will benefit from skilled therapeutic intervention in order to improve the following deficits and impairments:  Abnormal gait, Decreased activity tolerance, Decreased strength, Pain, Difficulty walking, Decreased range of motion  Visit Diagnosis: Pain in right ankle and joints of right foot  Stiffness of right ankle, not elsewhere classified  Difficulty in walking, not elsewhere classified     Problem List Patient Active Problem List   Diagnosis Date Noted  . S/P bilateral salpingo-oophorectomy 05/08/2016  . Pelvic pain 02/10/2013    Class: Hospitalized for  . Abnormal uterine bleeding 02/10/2013    Class: Hospitalized for  . Status post abdominal hysterectomy 02/10/2013    Class: Status post    Carney Living PT DPT  02/27/2017, 8:14 AM  [facility] [address] [place], [place], [address] Phone: [phone]   Fax:  [phone]  Name: JAMELLA GRAYER MRN: [id number] Date of Birth: 1965/03/28

## 2017-02-28 ENCOUNTER — Ambulatory Visit: Payer: Non-veteran care | Admitting: Physical Therapy

## 2017-02-28 ENCOUNTER — Encounter: Payer: Self-pay | Admitting: Physical Therapy

## 2017-02-28 DIAGNOSIS — M25671 Stiffness of right ankle, not elsewhere classified: Secondary | ICD-10-CM

## 2017-02-28 DIAGNOSIS — M25571 Pain in right ankle and joints of right foot: Secondary | ICD-10-CM

## 2017-02-28 DIAGNOSIS — R262 Difficulty in walking, not elsewhere classified: Secondary | ICD-10-CM

## 2017-02-28 NOTE — Therapy (Signed)
[place], [place], [address] Phone: [phone]   Fax:  [phone]  Physical Therapy Treatment  Patient Details  Name: Kristen Heath MRN: [id number] Date of Birth: [birth year] Referring Provider: Nelta Numbers NP    Encounter Date: 02/28/2017  PT End of Session - 02/28/17 1712    Visit Number  8    Number of Visits  12    Date for PT Re-Evaluation  03/08/17    Authorization Type  VA choice 12 visits approved     PT Start Time  1630    PT Stop Time  1722    PT Time Calculation (min)  52 min    Activity Tolerance  Patient tolerated treatment well    Behavior During Therapy  [facility] for tasks assessed/performed       Past Medical History:  Diagnosis Date  . Anemia   . Anxiety    no current meds  . Arthritis    knees  . Asthma    cough induced asthma  . CPAP (continuous positive airway pressure) dependence   . Dry skin   . Gestational diabetes   . Headache(784.0)    Migraines  . History of kidney stones    no surgery - passed stone  . Hyperlipidemia   . Hypertension   . Pre-diabetes   . PTSD (post-traumatic stress disorder)   . Seasonal allergies   . Sleep apnea    recent diagnosis 01/18/13     Past Surgical History:  Procedure Laterality Date  . ABDOMINAL HYSTERECTOMY N/A 02/09/2013   Procedure: HYSTERECTOMY ABDOMINAL;  Surgeon: Darlyn Chamber, MD;  Location: [facility];  Service: Gynecology;  Laterality: N/A;  . arthroscopic right knee     . CESAREAN SECTION  1993  . COLONOSCOPY    . CYSTOSCOPY N/A 05/08/2016   Procedure: CYSTOSCOPY;  Surgeon: Arvella Nigh, MD;  Location: [facility];  Service: Gynecology;  Laterality: N/A;  . LAPAROSCOPIC SALPINGO OOPHERECTOMY Bilateral 05/08/2016   Procedure: ATTEMPTED LAPAROSCOPIC SALPINGO OOPHORECTOMY;  Surgeon: Arvella Nigh, MD;  Location: [facility];  Service: Gynecology;  Laterality: Bilateral;  Glendon Axe RNFA confirmed 04/16/16  . MYOMECTOMY ABDOMINAL  APPROACH    . SALPINGOOPHORECTOMY  05/08/2016   Procedure: BILATERAL SALPINGO OOPHORECTOMY,OPEN;  Surgeon: Arvella Nigh, MD;  Location: [facility];  Service: Gynecology;;  . UNILATERAL SALPINGECTOMY Right 02/09/2013   Procedure: UNILATERAL SALPINGECTOMY;  Surgeon: Darlyn Chamber, MD;  Location: [facility];  Service: Gynecology;  Laterality: Right;  . WISDOM TOOTH EXTRACTION      There were no vitals filed for this visit.  Subjective Assessment - 02/28/17 1634    Subjective  Is out of boot 2 hours per night with a little pain-feels achey.     Currently in Pain?  Yes    Pain Score  5     Pain Location  Ankle    Pain Orientation  Right    Pain Descriptors / Indicators  Aching                      [facility] Adult PT Treatment/Exercise - 02/28/17 0001      Modalities   Modalities  Moist Heat      Moist Heat Therapy   Number Minutes Moist Heat  10 Minutes    Moist Heat Location  Other (comment) Rt gastroc      Manual Therapy   Manual therapy comments  skilled palpation & monitoring during TPDN  Soft tissue mobilization  IASTM Rt gastroc/soleus      Ankle Exercises: Aerobic   Stationary Bike  5 min L4      Ankle Exercises: Stretches   Gastroc Stretch Limitations  slant board & at counter      Ankle Exercises: Standing   Heel Raises  Other (comment) heel/toe raises at counter    Other Standing Ankle Exercises  retro stepping      Ankle Exercises: Seated   Other Seated Ankle Exercises  resisted PF blue tband, cues to stay midline       Trigger Point Dry Needling - 02/28/17 1654    Consent Given?  Yes    Education Handout Provided  -- verbal education    Muscles Treated Lower Body  -- medial gastroc           PT Education - 02/28/17 1716    Education provided  Yes    Education Details  TPDN and expected outcomes, boot wear, exercise form/rationale    Person(s) Educated  Patient    Methods  Explanation;Demonstration;Tactile cues;Verbal cues    Comprehension   Verbalized understanding;Need further instruction;Returned demonstration;Verbal cues required;Tactile cues required       PT Short Term Goals - 02/26/17 1650      PT SHORT TERM GOAL #1   Title  Patient will increase passive right dorsi-flexion to neutral   (Pended)     Time  4  (Pended)     Period  Weeks  (Pended)     Status  On-going  (Pended)       PT SHORT TERM GOAL #2   Title  Patient will increase gross right ankle strength to 4/5   (Pended)     Time  4  (Pended)     Period  Weeks  (Pended)     Status  On-going  (Pended)       PT SHORT TERM GOAL #3   Title  Patient will ambualte 200' without the boot with equal single leg stance and no increase in pain   (Pended)     Time  4  (Pended)     Period  Weeks  (Pended)     Status  On-going  (Pended)       PT SHORT TERM GOAL #4   Title  Patient will be independent with HEP   (Pended)     Time  4  (Pended)     Period  Weeks  (Pended)     Status  On-going  (Pended)         PT Long Term Goals - 01/28/17 0757      PT LONG TERM GOAL #1   Title  Patient will stand for 1 hour without pain in ordetr to perfrom work tasks     Time  6    Period  Weeks    Status  New      PT LONG TERM GOAL #2   Title  Patient will ambaulte 3000' without the boot and without self reported increase in pain in order to perfrom daily tasks     Time  6    Period  Weeks    Status  New      PT LONG TERM GOAL #3   Title  Patient will demsotrate a 39% limitation with her FOTO score    Time  6    Period  Weeks    Status  New  Plan - 02/28/17 1713    Clinical Impression Statement  Pt tolerated release of 2 trigger points with DN and then requested to stop due to discomfort. Twitch response noted with DN. Tightness in medial gastroc was resulting in uneven tension from achilles insertion. Reported soreness/ache as expected following. Encouarged pt to walk, stretch and massage as the evening progresses.     PT Treatment/Interventions   ADLs/Self Care Home Management;Cryotherapy;Electrical Stimulation;Iontophoresis 4mg /ml Dexamethasone;Stair training;Gait training;Therapeutic activities;Therapeutic exercise;Neuromuscular re-education;Patient/family education;Passive range of motion;Manual techniques;Taping;Traction;Moist Heat;Ultrasound    PT Next Visit Plan  effects of DN?, stairs, unstable surfaces, increase time out of boot; GCODE    PT Home Exercise Plan  ankle 4-way t-band; DF stretch, towel scrunches, toe yoga, seated heel/toe raises, windshield wiper; 4-way AROM; counter gastroc stretch    Consulted and Agree with Plan of Care  Patient       Patient will benefit from skilled therapeutic intervention in order to improve the following deficits and impairments:  Abnormal gait, Decreased activity tolerance, Decreased strength, Pain, Difficulty walking, Decreased range of motion  Visit Diagnosis: Pain in right ankle and joints of right foot  Stiffness of right ankle, not elsewhere classified  Difficulty in walking, not elsewhere classified     Problem List Patient Active Problem List   Diagnosis Date Noted  . S/P bilateral salpingo-oophorectomy 05/08/2016  . Pelvic pain 02/10/2013    Class: Hospitalized for  . Abnormal uterine bleeding 02/10/2013    Class: Hospitalized for  . Status post abdominal hysterectomy 02/10/2013    Class: Status post    Rannie Craney C. Davinci Glotfelty PT, DPT 02/28/17 5:17 PM   [facility] [address] [place], [place], [address] Phone: [phone]   Fax:  [phone]  Name: Kristen Heath MRN: [id number] Date of Birth: [birth year]

## 2017-03-05 ENCOUNTER — Encounter: Payer: Federal, State, Local not specified - PPO | Admitting: Physical Therapy

## 2017-03-27 ENCOUNTER — Ambulatory Visit: Payer: Non-veteran care | Admitting: Physical Therapy

## 2017-04-09 ENCOUNTER — Ambulatory Visit: Payer: Non-veteran care | Attending: *Deleted | Admitting: Physical Therapy

## 2017-04-09 ENCOUNTER — Encounter: Payer: Self-pay | Admitting: Physical Therapy

## 2017-04-09 DIAGNOSIS — M25671 Stiffness of right ankle, not elsewhere classified: Secondary | ICD-10-CM | POA: Diagnosis present

## 2017-04-09 DIAGNOSIS — R262 Difficulty in walking, not elsewhere classified: Secondary | ICD-10-CM | POA: Diagnosis present

## 2017-04-09 DIAGNOSIS — M25571 Pain in right ankle and joints of right foot: Secondary | ICD-10-CM

## 2017-04-10 NOTE — Therapy (Addendum)
[facility] [place], [place], [address] Phone: [phone]   Fax:  [phone]  Physical Therapy Evaluation/ Discharge   Patient Details  Name: Kristen Heath MRN: [id number] Date of Birth: 1965-09-04 Referring Provider: Nelta Numbers NP    Encounter Date: 04/09/2017  PT End of Session - 04/09/17 1644    Visit Number  9    Number of Visits  12    Date for PT Re-Evaluation  05/21/17    Authorization Type  [facility] choice 12 visits approved     PT Start Time  1632    PT Stop Time  4132    PT Time Calculation (min)  43 min    Activity Tolerance  Patient tolerated treatment well    Behavior During Therapy  [employer] for tasks assessed/performed       Past Medical History:  Diagnosis Date  . Anemia   . Anxiety    no current meds  . Arthritis    knees  . Asthma    cough induced asthma  . CPAP (continuous positive airway pressure) dependence   . Dry skin   . Gestational diabetes   . Headache(784.0)    Migraines  . History of kidney stones    no surgery - passed stone  . Hyperlipidemia   . Hypertension   . Pre-diabetes   . PTSD (post-traumatic stress disorder)   . Seasonal allergies   . Sleep apnea    recent diagnosis 01/18/13     Past Surgical History:  Procedure Laterality Date  . ABDOMINAL HYSTERECTOMY N/A 02/09/2013   Procedure: HYSTERECTOMY ABDOMINAL;  Surgeon: Darlyn Chamber, MD;  Location: [facility];  Service: Gynecology;  Laterality: N/A;  . arthroscopic right knee     . CESAREAN SECTION  1993  . COLONOSCOPY    . CYSTOSCOPY N/A 05/08/2016   Procedure: CYSTOSCOPY;  Surgeon: Arvella Nigh, MD;  Location: [facility];  Service: Gynecology;  Laterality: N/A;  . LAPAROSCOPIC SALPINGO OOPHERECTOMY Bilateral 05/08/2016   Procedure: ATTEMPTED LAPAROSCOPIC SALPINGO OOPHORECTOMY;  Surgeon: Arvella Nigh, MD;  Location: [facility];  Service: Gynecology;  Laterality: Bilateral;  Glendon Axe RNFA confirmed 04/16/16  .  MYOMECTOMY ABDOMINAL APPROACH    . SALPINGOOPHORECTOMY  05/08/2016   Procedure: BILATERAL SALPINGO OOPHORECTOMY,OPEN;  Surgeon: Arvella Nigh, MD;  Location: [facility];  Service: Gynecology;;  . UNILATERAL SALPINGECTOMY Right 02/09/2013   Procedure: UNILATERAL SALPINGECTOMY;  Surgeon: Darlyn Chamber, MD;  Location: [facility];  Service: Gynecology;  Laterality: Right;  . WISDOM TOOTH EXTRACTION      There were no vitals filed for this visit.   Subjective Assessment - 04/09/17 1637    Subjective  Patient has not been seen since 02/28/2018. She has had family issues so she has not been able to cometo therapy. She continues to have pain in her foot.     Limitations  Sitting;Standing;Walking    Diagnostic tests  Nothing in the chart     Patient Stated Goals  to have less pain     Currently in Pain?  Yes    Pain Score  6     Pain Location  Ankle    Pain Orientation  Right    Pain Descriptors / Indicators  Aching    Pain Type  Chronic pain    Pain Onset  More than a month ago    Aggravating Factors   walking    Pain Relieving Factors  ice     Effect of  Pain on Daily Activities  difficulty walking distances     Pain Score  5    Pain Location  Foot    Pain Orientation  Right    Pain Descriptors / Indicators  Aching    Pain Type  Chronic pain    Pain Onset  More than a month ago    Pain Frequency  Constant    Aggravating Factors   standing and walking     Pain Relieving Factors  rest     Effect of Pain on Daily Activities  difficulty walking distances          [facility] PT Assessment - 04/09/17 0001      PROM   Right Ankle Dorsiflexion  -10    Right Ankle Plantar Flexion  25    Right Ankle Inversion  20    Right Ankle Eversion  10      Strength   Right Ankle Dorsiflexion  3+/5    Right Ankle Inversion  4/5    Right Ankle Eversion  4/5      Palpation   Palpation comment  tenderness in posterior lateral malleolus; trigger points throughout the anterior tib; tenderness to palpation in the  plantar facia              Objective measurements completed on examination: See above findings.      [facility] PT Treatment/Exercise - 04/10/17 0001      Manual Therapy   Joint Mobilization  talocrural distraction; talor glides     Passive ROM  DF stretching       Ankle Exercises: Standing   Heel Raises  20 reps in limited range 2x10     Other Standing Ankle Exercises  slow march 2x10; mini squats 2x10       Ankle Exercises: Seated   Other Seated Ankle Exercises  resisted PF eversion DFred tband,       Ankle Exercises: Stretches   Other Stretch  hamstring stretches 3x20sec hold; thomas stretch 3x20 sec hold              PT Education - 04/09/17 1640    Education provided  Yes    Education Details  reviewed HEP; symptom mangement.    Person(s) Educated  Patient    Methods  Explanation;Demonstration;Tactile cues;Verbal cues    Comprehension  Verbalized understanding;Returned demonstration;Tactile cues required;Verbal cues required       PT Short Term Goals - 04/09/17 1702      PT SHORT TERM GOAL #1   Title  Patient will increase passive right dorsi-flexion to neutral     Baseline  -7 (04/09/2017)    Time  4    Period  Weeks    Status  On-going    Target Date  05/07/17      PT SHORT TERM GOAL #2   Title  Patient will increase gross right ankle strength to 4/5     Baseline  PF still limited     Time  4    Period  Weeks    Status  On-going    Target Date  05/07/17      PT SHORT TERM GOAL #3   Title  Patient will ambualte 200' without the boot with equal single leg stance and no increase in pain     Time  4    Period  Weeks    Status  On-going      PT SHORT TERM GOAL #4   Title  Patient  will be independent with HEP     Baseline  working on her exercises at home     Time  4    Period  Weeks    Status  On-going    Target Date  05/07/17        PT Long Term Goals - 01/28/17 0757      PT LONG TERM GOAL #1   Title  Patient will stand for 1 hour  without pain in ordetr to perfrom work tasks     Time  6    Period  Weeks    Status  New      PT LONG TERM GOAL #2   Title  Patient will ambaulte 3000' without the boot and without self reported increase in pain in order to perfrom daily tasks     Time  6    Period  Weeks    Status  New      PT LONG TERM GOAL #3   Title  Patient will demsotrate a 39% limitation with her FOTO score    Time  6    Period  Weeks    Status  New             Plan - 04/09/17 1657    Clinical Impression Statement  Patient has not been seen for 5 weeks 2nd to personal reasons. In that time her strength has improved. Her range has improved slightly but is still limited. She is up to two hours out of the boot without pain. She has been to an orthopedic who advised her to continue therapy. She would benefit from further skilled therapy to improve her ankle active dorsiflexion to nuetral and to continue to improve single leg stability and genral akle/foot stability. Patient would benefit from 2x a week for 4 more weeks.     Clinical Presentation  Evolving    Clinical Decision Making  Low    Rehab Potential  Good    PT Frequency  2x / week    PT Duration  4 weeks    PT Treatment/Interventions  ADLs/Self Care Home Management;Cryotherapy;Electrical Stimulation;Iontophoresis 3m/ml Dexamethasone;Stair training;Gait training;Therapeutic activities;Therapeutic exercise;Neuromuscular re-education;Patient/family education;Passive range of motion;Manual techniques;Taping;Traction;Moist Heat;Ultrasound    PT Next Visit Plan  continue with strengthening and stretching     PT Home Exercise Plan  ankle 4-way t-band; DF stretch, towel scrunches, toe yoga, seated heel/toe raises, windshield wiper; 4-way AROM; counter gastroc stretch    Consulted and Agree with Plan of Care  Patient       Patient will benefit from skilled therapeutic intervention in order to improve the following deficits and impairments:  Abnormal gait,  Decreased activity tolerance, Decreased strength, Pain, Difficulty walking, Decreased range of motion  Visit Diagnosis: Pain in right ankle and joints of right foot - Plan: PT plan of care cert/re-cert  Stiffness of right ankle, not elsewhere classified - Plan: PT plan of care cert/re-cert  Difficulty in walking, not elsewhere classified - Plan: PT plan of care cert/re-cert   PHYSICAL THERAPY DISCHARGE SUMMARY  Visits from Start of Care: 9  Current functional level related to goals / functional outcomes: Patient did not show for last visit.    Remaining deficits: Unknown  Education / Equipment: HEP  Plan:   Patient agrees to discharge.  Patient goals were not met. Patient is being discharged due to meeting the stated rehab goals.  ?????      Problem List Patient Active Problem List   Diagnosis Date Noted  .  S/P bilateral salpingo-oophorectomy 05/08/2016  . Pelvic pain 02/10/2013    Class: Hospitalized for  . Abnormal uterine bleeding 02/10/2013    Class: Hospitalized for  . Status post abdominal hysterectomy 02/10/2013    Class: Status post    Carney Living PT DPT  04/10/2017, 8:00 AM  [facility] [address] [place], [place], [address] Phone: [phone]   Fax:  [phone]  Name: LAVADA LANGSAM MRN: [id number] Date of Birth: Oct 21, 1965

## 2017-04-16 ENCOUNTER — Encounter: Payer: Non-veteran care | Admitting: Physical Therapy

## 2017-04-18 ENCOUNTER — Encounter: Payer: Non-veteran care | Admitting: Physical Therapy

## 2017-04-23 ENCOUNTER — Encounter: Payer: Non-veteran care | Admitting: Physical Therapy

## 2017-04-24 ENCOUNTER — Encounter: Payer: Non-veteran care | Admitting: Physical Therapy

## 2017-04-30 ENCOUNTER — Encounter: Payer: Non-veteran care | Admitting: Physical Therapy

## 2017-06-23 DIAGNOSIS — E119 Type 2 diabetes mellitus without complications: Secondary | ICD-10-CM | POA: Insufficient documentation

## 2018-05-30 DIAGNOSIS — M545 Low back pain, unspecified: Secondary | ICD-10-CM | POA: Insufficient documentation

## 2019-02-12 ENCOUNTER — Other Ambulatory Visit: Payer: Self-pay | Admitting: Cardiology

## 2019-02-12 DIAGNOSIS — Z20822 Contact with and (suspected) exposure to covid-19: Secondary | ICD-10-CM

## 2019-02-16 LAB — NOVEL CORONAVIRUS, NAA: SARS-CoV-2, NAA: NOT DETECTED

## 2019-03-17 DIAGNOSIS — J4530 Mild persistent asthma, uncomplicated: Secondary | ICD-10-CM | POA: Insufficient documentation

## 2020-02-10 ENCOUNTER — Other Ambulatory Visit: Payer: Self-pay

## 2020-02-10 ENCOUNTER — Ambulatory Visit (INDEPENDENT_AMBULATORY_CARE_PROVIDER_SITE_OTHER): Payer: No Typology Code available for payment source | Admitting: Podiatry

## 2020-02-10 ENCOUNTER — Ambulatory Visit (INDEPENDENT_AMBULATORY_CARE_PROVIDER_SITE_OTHER): Payer: No Typology Code available for payment source

## 2020-02-10 ENCOUNTER — Encounter: Payer: Self-pay | Admitting: Podiatry

## 2020-02-10 DIAGNOSIS — M7751 Other enthesopathy of right foot: Secondary | ICD-10-CM | POA: Diagnosis not present

## 2020-02-10 DIAGNOSIS — M7752 Other enthesopathy of left foot: Secondary | ICD-10-CM | POA: Diagnosis not present

## 2020-02-10 DIAGNOSIS — G43909 Migraine, unspecified, not intractable, without status migrainosus: Secondary | ICD-10-CM | POA: Insufficient documentation

## 2020-02-10 DIAGNOSIS — T7840XA Allergy, unspecified, initial encounter: Secondary | ICD-10-CM | POA: Insufficient documentation

## 2020-02-10 DIAGNOSIS — M779 Enthesopathy, unspecified: Secondary | ICD-10-CM | POA: Diagnosis not present

## 2020-02-10 DIAGNOSIS — M25569 Pain in unspecified knee: Secondary | ICD-10-CM | POA: Insufficient documentation

## 2020-02-10 DIAGNOSIS — M7750 Other enthesopathy of unspecified foot: Secondary | ICD-10-CM

## 2020-02-10 DIAGNOSIS — D649 Anemia, unspecified: Secondary | ICD-10-CM | POA: Insufficient documentation

## 2020-02-10 DIAGNOSIS — Z9989 Dependence on other enabling machines and devices: Secondary | ICD-10-CM | POA: Insufficient documentation

## 2020-02-10 DIAGNOSIS — E1169 Type 2 diabetes mellitus with other specified complication: Secondary | ICD-10-CM | POA: Insufficient documentation

## 2020-02-10 NOTE — Progress Notes (Signed)
Subjective:   Patient ID: Kristen Heath, female   DOB: 54 y.o.   MRN: [id number]   HPI Patient presents stating that she has a very painful ankle right and that its been more recent and has had trouble with both feet over period of time.  States the right has been worse and she is tried different treatment options so far without relief and is just left the [employer] and is not on her feet as much.  Patient does not smoke likes to be active   Review of Systems  All other systems reviewed and are negative.       Objective:  Physical Exam Vitals and nursing note reviewed.  Constitutional:      Appearance: She is well-developed.  Pulmonary:     Effort: Pulmonary effort is normal.  Musculoskeletal:        General: Normal range of motion.  Skin:    General: Skin is warm.  Neurological:     Mental Status: She is alert.     Neurovascular status intact muscle strength found to be adequate range of motion within normal limits.  Patient is found to have exquisite discomfort in the right sinus tarsi inflammation fluid buildup with moderate depression of the arch.  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  Instructed on possibility for orthotics or other treatment options  X-rays indicate no signs of fracture or diastases injury or advanced arthritis of the ankle or subtalar joint

## 2020-03-02 ENCOUNTER — Ambulatory Visit (INDEPENDENT_AMBULATORY_CARE_PROVIDER_SITE_OTHER): Payer: No Typology Code available for payment source | Admitting: Podiatry

## 2020-03-02 ENCOUNTER — Encounter: Payer: Self-pay | Admitting: Podiatry

## 2020-03-02 ENCOUNTER — Other Ambulatory Visit: Payer: Self-pay

## 2020-03-02 DIAGNOSIS — M779 Enthesopathy, unspecified: Secondary | ICD-10-CM | POA: Diagnosis not present

## 2020-03-02 MED ORDER — TRIAMCINOLONE ACETONIDE 10 MG/ML IJ SUSP
10.0000 mg | Freq: Once | INTRAMUSCULAR | Status: AC
  Administered 2020-03-02: 10 mg

## 2020-03-03 NOTE — Progress Notes (Signed)
Subjective:   Patient ID: Kristen Heath, female   DOB: 54 y.o.   MRN: [id number]   HPI Patient states that she is improved but she still has 1 spot on her right foot that has been aggravating and making it hard for her to be active.  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

## 2021-07-11 ENCOUNTER — Emergency Department (HOSPITAL_COMMUNITY): Payer: No Typology Code available for payment source

## 2021-07-11 ENCOUNTER — Emergency Department (HOSPITAL_COMMUNITY)
Admission: EM | Admit: 2021-07-11 | Discharge: 2021-07-11 | Disposition: A | Discharge status: Home / Self Care | Payer: No Typology Code available for payment source | Attending: Emergency Medicine | Admitting: Emergency Medicine

## 2021-07-11 DIAGNOSIS — M549 Dorsalgia, unspecified: Secondary | ICD-10-CM | POA: Diagnosis not present

## 2021-07-11 DIAGNOSIS — M25551 Pain in right hip: Secondary | ICD-10-CM | POA: Insufficient documentation

## 2021-07-11 DIAGNOSIS — S161XXA Strain of muscle, fascia and tendon at neck level, initial encounter: Secondary | ICD-10-CM | POA: Diagnosis not present

## 2021-07-11 DIAGNOSIS — M791 Myalgia, unspecified site: Secondary | ICD-10-CM

## 2021-07-11 DIAGNOSIS — R519 Headache, unspecified: Secondary | ICD-10-CM | POA: Insufficient documentation

## 2021-07-11 DIAGNOSIS — Y92410 Unspecified street and highway as the place of occurrence of the external cause: Secondary | ICD-10-CM | POA: Insufficient documentation

## 2021-07-11 DIAGNOSIS — Z7984 Long term (current) use of oral hypoglycemic drugs: Secondary | ICD-10-CM | POA: Diagnosis not present

## 2021-07-11 DIAGNOSIS — S199XXA Unspecified injury of neck, initial encounter: Secondary | ICD-10-CM | POA: Diagnosis present

## 2021-07-11 MED ORDER — CYCLOBENZAPRINE HCL 10 MG PO TABS: 10.0000 mg | ORAL_TABLET | Freq: Two times a day (BID) | ORAL | 0 refills | Status: DC | PRN

## 2021-07-11 MED ORDER — IBUPROFEN 400 MG PO TABS
600.0000 mg | ORAL_TABLET | Freq: Once | ORAL | Status: AC
  Administered 2021-07-11: 600 mg via ORAL
  Filled 2021-07-11: qty 1

## 2021-07-11 MED ORDER — ACETAMINOPHEN 325 MG PO TABS: 650.0000 mg | ORAL_TABLET | Freq: Four times a day (QID) | ORAL | 0 refills | Status: DC | PRN

## 2021-07-11 MED ORDER — CYCLOBENZAPRINE HCL 10 MG PO TABS
10.0000 mg | ORAL_TABLET | Freq: Once | ORAL | Status: AC
  Administered 2021-07-11: 10 mg via ORAL
  Filled 2021-07-11: qty 1

## 2021-07-11 MED ORDER — ACETAMINOPHEN 325 MG PO TABS
650.0000 mg | ORAL_TABLET | Freq: Once | ORAL | Status: AC
  Administered 2021-07-11: 650 mg via ORAL
  Filled 2021-07-11: qty 2

## 2021-07-11 MED ORDER — IBUPROFEN 600 MG PO TABS: 600.0000 mg | ORAL_TABLET | Freq: Three times a day (TID) | ORAL | 0 refills | Status: DC | PRN
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
## 2021-07-11 NOTE — ED Notes (Signed)
Pt verbalized understanding of d/c instructions, meds, and followup care. Denies questions. VSS, no distress noted. Steady gait to exit with all belongings. Has ride home. 

## 2021-11-25 ENCOUNTER — Ambulatory Visit
Admission: EM | Admit: 2021-11-25 | Discharge: 2021-11-25 | Disposition: A | Discharge status: Home / Self Care | Payer: Federal, State, Local not specified - PPO

## 2021-11-25 ENCOUNTER — Encounter: Payer: Self-pay | Admitting: Emergency Medicine

## 2021-11-25 DIAGNOSIS — J069 Acute upper respiratory infection, unspecified: Secondary | ICD-10-CM | POA: Diagnosis not present

## 2021-11-25 DIAGNOSIS — Z1152 Encounter for screening for COVID-19: Secondary | ICD-10-CM

## 2021-11-25 NOTE — ED Provider Notes (Signed)
[facility]    CSN: [id number] Arrival date & time: 11/25/21  1332      History   Chief Complaint Chief Complaint  Patient presents with   Headache    HPI Kristen Heath is a 56 y.o. female.   Patient here today for evaluation of headache, congestion, and cough that started a few days ago.  She recently discovered she had a coworker that tested positive for COVID.  She states she has not had high fever but did feel somewhat feverish with sweating recently.  She has not had any vomiting or diarrhea.  She does report some sore throat but no ear pain.  The history is provided by the patient.  Headache Associated symptoms: congestion, cough and sore throat   Associated symptoms: no abdominal pain, no diarrhea, no ear pain, no fever, no nausea and no vomiting     Past Medical History:  Diagnosis Date   Anemia    Anxiety    no current meds   Arthritis    knees   Asthma    cough induced asthma   CPAP (continuous positive airway pressure) dependence    Dry skin    Gestational diabetes    Headache(784.0)    Migraines   History of kidney stones    no surgery - passed stone   Hyperlipidemia    Hypertension    Pre-diabetes    PTSD (post-traumatic stress disorder)    Seasonal allergies    Sleep apnea    recent diagnosis 01/18/13     Patient Active Problem List   Diagnosis Date Noted   Allergy 02/10/2020   Anemia 02/10/2020   Hyperlipidemia associated with type 2 diabetes mellitus ([place]) 02/10/2020   Knee pain 02/10/2020   Migraines 02/10/2020   OSA on CPAP 02/10/2020   Severe obesity (BMI 35.0-39.9) with comorbidity ([place]) 05/11/2019   Mild persistent asthma without complication 40/34/7425   Low back pain 05/30/2018   Type 2 diabetes mellitus without complication, without long-term current use of insulin ([place]) 06/23/2017   S/P bilateral salpingo-oophorectomy 05/08/2016   Pelvic pain 02/10/2013    Class: Hospitalized for   Abnormal uterine  bleeding 02/10/2013    Class: Hospitalized for   Status post abdominal hysterectomy 02/10/2013    Class: Status post    Past Surgical History:  Procedure Laterality Date   ABDOMINAL HYSTERECTOMY N/A 02/09/2013   Procedure: HYSTERECTOMY ABDOMINAL;  Surgeon: Darlyn Chamber, MD;  Location: [facility];  Service: Gynecology;  Laterality: N/A;   arthroscopic right knee      [place] N/A 05/08/2016   Procedure: CYSTOSCOPY;  Surgeon: Arvella Nigh, MD;  Location: [facility];  Service: Gynecology;  Laterality: N/A;   LAPAROSCOPIC SALPINGO OOPHERECTOMY Bilateral 05/08/2016   Procedure: ATTEMPTED LAPAROSCOPIC SALPINGO OOPHORECTOMY;  Surgeon: Arvella Nigh, MD;  Location: [facility];  Service: Gynecology;  Laterality: Bilateral;  Glendon Axe RNFA confirmed 04/16/16   MYOMECTOMY ABDOMINAL APPROACH     SALPINGOOPHORECTOMY  05/08/2016   Procedure: BILATERAL SALPINGO OOPHORECTOMY,OPEN;  Surgeon: Arvella Nigh, MD;  Location: [facility];  Service: Gynecology;;   UNILATERAL SALPINGECTOMY Right 02/09/2013   Procedure: UNILATERAL SALPINGECTOMY;  Surgeon: Darlyn Chamber, MD;  Location: [facility];  Service: Gynecology;  Laterality: Right;   WISDOM TOOTH EXTRACTION      OB History   No obstetric history on file.      Home Medications    Prior to Admission medications  Medication Sig Start Date End Date Taking? Authorizing Provider  acetaminophen (TYLENOL) 325 MG tablet Take 2 tablets (650 mg total) by mouth every 6 (six) hours as needed for up to 30 doses for moderate pain or fever. 07/11/21   Wyvonnia Dusky, MD  albuterol (PROVENTIL HFA;VENTOLIN HFA) 108 (90 BASE) MCG/ACT inhaler Inhale 2 puffs into the lungs every 6 (six) hours as needed for wheezing or shortness of breath.     [provider]  amoxicillin (AMOXIL) 500 MG capsule amoxicillin 500 mg capsule    [provider]  atorvastatin (LIPITOR) 20 MG tablet Take 20 mg by mouth daily at 6 PM.    [provider]  Capsaicin 0.1 % CREA Apply topically.    [provider]  cetirizine (ZYRTEC) 10 MG tablet Take 10 mg by mouth daily.    [provider]  cholecalciferol (VITAMIN D) 1000 units tablet Take 2,000 Units by mouth daily.    [provider]  cyclobenzaprine (FLEXERIL) 10 MG tablet Take 1 tablet (10 mg total) by mouth 2 (two) times daily as needed for up to 15 doses for muscle spasms. 07/11/21   Wyvonnia Dusky, MD  diclofenac Sodium (VOLTAREN) 1 % GEL diclofenac 1 % topical gel    [provider]  docusate sodium (COLACE) 100 MG capsule Take 200 mg by mouth daily.     [provider]  fluticasone (FLONASE) 50 MCG/ACT nasal spray Place 2 sprays into both nostrils daily. 03/28/14   Waynetta Pean, PA-C  gabapentin (NEURONTIN) 100 MG capsule Take 100 mg by mouth daily. 01/13/20   [provider]  hydrochlorothiazide (HYDRODIURIL) 25 MG tablet hydrochlorothiazide 25 mg tablet    [provider]  ibuprofen (ADVIL) 600 MG tablet Take 1 tablet (600 mg total) by mouth every 8 (eight) hours as needed for up to 30 doses for mild pain or moderate pain. Take with food 07/11/21   Wyvonnia Dusky, MD  ibuprofen (ADVIL) 800 MG tablet Take by mouth. 04/07/19   [provider]  lisinopril-hydrochlorothiazide (ZESTORETIC) 10-12.5 MG tablet Take 1 tablet by mouth daily. 12/17/17   [provider]  metFORMIN (GLUCOPHAGE) 500 MG tablet metformin 500 mg tablet    [provider]  montelukast (SINGULAIR) 10 MG tablet  01/13/20   [provider]  NEOMYCIN-POLYMYXIN-HYDROCORTISONE (CORTISPORIN) 1 % SOLN OTIC solution SMARTSIG:In Ear(s) 12/16/19   [provider]  polyethylene glycol (MIRALAX / GLYCOLAX) packet Take 17 g by mouth daily.    [provider]  Sod Fluoride-Potassium Nitrate (PREVIDENT 5000 SENSITIVE) 1.1-5 % GEL PreviDent 5000 Sensitive 1.1 %-5 % dental paste    [provider]   SUMAtriptan (IMITREX) 50 MG tablet Take 50 mg by mouth as needed for migraine. May repeat in 2 hours if headache persists or recurs.    [provider]  topiramate (TOPAMAX) 25 MG tablet Take by mouth.    [provider]  zolpidem (AMBIEN) 5 MG tablet Take 5 mg by mouth at bedtime as needed for sleep.    [provider]    Family History History reviewed. No pertinent family history.  Social History Social History   Tobacco Use   Smoking status: Never   Smokeless tobacco: Never  Substance Use Topics   Alcohol use: Yes    Comment: occ   Drug use: No     Allergies   Niacin and related   Review of Systems Review of Systems  Constitutional:  Negative for  chills and fever.  HENT:  Positive for congestion and sore throat. Negative for ear pain.   Eyes:  Negative for discharge and redness.  Respiratory:  Positive for cough. Negative for shortness of breath and wheezing.   Gastrointestinal:  Negative for abdominal pain, diarrhea, nausea and vomiting.  Neurological:  Positive for headaches.     Physical Exam Triage Vital Signs ED Triage Vitals  Enc Vitals Group     BP 11/25/21 1352 131/88     Pulse Rate 11/25/21 1352 78     Resp 11/25/21 1352 16     Temp 11/25/21 1352 98.2 F (36.8 C)     Temp Source 11/25/21 1352 Oral     SpO2 11/25/21 1352 98 %     Weight --      Height --      Head Circumference --      Peak Flow --      Pain Score 11/25/21 1351 6     Pain Loc --      Pain Edu? --      Excl. in [place]? --    No data found.  Updated Vital Signs BP 131/88 (BP Location: Right Arm)   Pulse 78   Temp 98.2 F (36.8 C) (Oral)   Resp 16   LMP 01/22/2013   SpO2 98%     Physical Exam Vitals and nursing note reviewed.  Constitutional:      General: She is not in acute distress.    Appearance: Normal appearance. She is not ill-appearing.  HENT:     Head: Normocephalic and atraumatic.     Nose: Congestion present.     Mouth/Throat:      Mouth: Mucous membranes are moist.     Pharynx: No oropharyngeal exudate or posterior oropharyngeal erythema.  Eyes:     Conjunctiva/sclera: Conjunctivae normal.  Cardiovascular:     Rate and Rhythm: Normal rate and regular rhythm.     Heart sounds: Normal heart sounds. No murmur heard. Pulmonary:     Effort: Pulmonary effort is normal. No respiratory distress.     Breath sounds: Normal breath sounds. No wheezing, rhonchi or rales.  Skin:    General: Skin is warm and dry.  Neurological:     Mental Status: She is alert.  Psychiatric:        Mood and Affect: Mood normal.        Thought Content: Thought content normal.      UC Treatments / Results  Labs (all labs ordered are listed, but only abnormal results are displayed) Labs Reviewed  SARS CORONAVIRUS 2 (TAT 6-24 HRS)    EKG   Radiology No results found.  Procedures Procedures (including critical care time)  Medications Ordered in UC Medications - No data to display  Initial Impression / Assessment and Plan / UC Course  I have reviewed the triage vital signs and the nursing notes.  Pertinent labs & imaging results that were available during my care of the patient were reviewed by me and considered in my medical decision making (see chart for details).    Suspect viral etiology of symptoms- will screen for covid. Encouraged symptomatic treatment, increased fluids and rest,  follow up with any further concerns or persistent or worsening symptoms.   Final Clinical Impressions(s) / UC Diagnoses   Final diagnoses:  Encounter for screening for COVID-19  Acute upper respiratory infection   Discharge Instructions   None    ED Prescriptions   None  PDMP not reviewed this encounter.   Francene Finders, PA-C 11/25/21 1430

## 2021-11-25 NOTE — ED Triage Notes (Addendum)
Pt said she was around a co-worker that tested positive and now for  2 days she has had headache, cough, congestion and aching.

## 2021-11-27 ENCOUNTER — Telehealth: Payer: Self-pay

## 2021-11-27 ENCOUNTER — Other Ambulatory Visit (HOSPITAL_COMMUNITY)
Admission: RE | Admit: 2021-11-27 | Discharge: 2021-11-27 | Disposition: A | Discharge status: Home / Self Care | Payer: Federal, State, Local not specified - PPO | Attending: Internal Medicine | Admitting: Internal Medicine

## 2021-11-27 DIAGNOSIS — Z20822 Contact with and (suspected) exposure to covid-19: Secondary | ICD-10-CM | POA: Insufficient documentation

## 2021-11-27 DIAGNOSIS — U071 COVID-19: Secondary | ICD-10-CM

## 2021-11-27 NOTE — Telephone Encounter (Signed)
Pt called for update to COVID sample collected 2 days ago. Appears it was rejected by lab. RN advised pt to return to clinic for recollect.

## 2021-11-27 NOTE — Telephone Encounter (Signed)
Pt returned to clinic to reswab for covid from 2 days ago.

## 2021-11-29 LAB — SARS CORONAVIRUS 2 (TAT 6-24 HRS): SARS Coronavirus 2: NEGATIVE

## 2022-03-15 ENCOUNTER — Encounter (HOSPITAL_BASED_OUTPATIENT_CLINIC_OR_DEPARTMENT_OTHER): Payer: Self-pay | Admitting: Pulmonary Disease

## 2022-03-15 ENCOUNTER — Other Ambulatory Visit (HOSPITAL_BASED_OUTPATIENT_CLINIC_OR_DEPARTMENT_OTHER): Payer: Self-pay

## 2022-03-15 ENCOUNTER — Ambulatory Visit (INDEPENDENT_AMBULATORY_CARE_PROVIDER_SITE_OTHER): Payer: No Typology Code available for payment source | Admitting: Pulmonary Disease

## 2022-03-15 ENCOUNTER — Ambulatory Visit (HOSPITAL_BASED_OUTPATIENT_CLINIC_OR_DEPARTMENT_OTHER): Payer: No Typology Code available for payment source

## 2022-03-15 VITALS — BP 118/78 | HR 91 | Ht 64.0 in | Wt 200.4 lb

## 2022-03-15 DIAGNOSIS — R059 Cough, unspecified: Secondary | ICD-10-CM

## 2022-03-15 LAB — PULMONARY FUNCTION TEST
DL/VA % pred: 126 %
DL/VA: 5.38 ml/min/mmHg/L
DLCO unc % pred: 105 %
DLCO unc: 21.7 ml/min/mmHg
FEF 25-75 Post: 2.53 L/sec
FEF 25-75 Pre: 1.17 L/sec
FEF2575-%Change-Post: 116 %
FEF2575-%Pred-Post: 98 %
FEF2575-%Pred-Pre: 45 %
FEV1-%Change-Post: 27 %
FEV1-%Pred-Post: 87 %
FEV1-%Pred-Pre: 68 %
FEV1-Post: 2.34 L
FEV1-Pre: 1.84 L
FEV1FVC-%Change-Post: 33 %
FEV1FVC-%Pred-Pre: 82 %
FEV6-%Change-Post: -1 %
FEV6-%Pred-Post: 81 %
FEV6-%Pred-Pre: 82 %
FEV6-Post: 2.7 L
FEV6-Pre: 2.73 L
FEV6FVC-%Change-Post: 0 %
FEV6FVC-%Pred-Post: 103 %
FEV6FVC-%Pred-Pre: 102 %
FVC-%Change-Post: -4 %
FVC-%Pred-Post: 78 %
FVC-%Pred-Pre: 82 %
FVC-Post: 2.7 L
FVC-Pre: 2.84 L
Post FEV1/FVC ratio: 87 %
Post FEV6/FVC ratio: 100 %
Pre FEV1/FVC ratio: 65 %
Pre FEV6/FVC Ratio: 100 %

## 2022-03-15 MED ORDER — FLUTICASONE-SALMETEROL 250-50 MCG/ACT IN AEPB
1.0000 | INHALATION_SPRAY | Freq: Two times a day (BID) | RESPIRATORY_TRACT | 5 refills | Status: DC
Start: 2022-03-15 — End: 2022-12-10

## 2022-03-15 NOTE — Patient Instructions (Signed)
Pre/Post Spirometry & DLCO Performed Today.

## 2022-03-15 NOTE — Progress Notes (Signed)
Subjective:   PATIENT ID: Kristen Heath GENDER: female DOB: 08-15-1965, MRN: [id number]  Chief Complaint  Patient presents with   Consult    Cough, chronic fatigue, chest tightness    Reason for Visit: New consult for cough  Ms. Kristen Heath is a 56 year old never smoker with asthma, HTN, DM2, HLD, allergic rhinitis, chronic headaches and OSA/insomnia who presents for cough, fatigue and chest tightness.  Referred by [facility] PCP for persistent cough and blood tingued sputum. Tried on GERD management without improvement. NL PFTs, CXR. Question for bronchoscopy.  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 Since 2015 she has had chronic cough and chest tightness. Was seen by [facility] pulmonologist and prescribed albuterol and singulair for cough variant asthma. May have been on maintenance inhalers but unable to recall. Used her daughter's nebulizer.  Her symptoms of productive cough, shortness of breath and wheezing are unchanged. Unable to tolerate aspirin. Denies personal or family history of asthma. She does have some mild hemoptysis described as blood mixed in sputum that only occurs in the morning. Denies frank hemoptysis. Has abdominal pain due to frequent coughing.  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  Past Medical History:  Diagnosis Date   Anemia    Anxiety    no current meds   Arthritis    knees   Asthma    cough induced asthma   CPAP (continuous positive airway pressure) dependence    Dry skin    Gestational diabetes    Headache(784.0)    Migraines   History of kidney stones    no surgery - passed stone    Hyperlipidemia    Hypertension    Pre-diabetes    PTSD (post-traumatic stress disorder)    Seasonal allergies    Sleep apnea    recent diagnosis 01/18/13      History reviewed. No pertinent family history.   Social History   Occupational History   Not on file  Tobacco Use   Smoking status: Never   Smokeless tobacco: Never  Substance and Sexual Activity   Alcohol use: Yes    Comment: occ   Drug use: No   Sexual activity: Not Currently    Birth control/protection: None    Allergies  Allergen Reactions   Modafinil Rash   Niacin And Related Other (See Comments)     Outpatient Medications Prior to Visit  Medication Sig Dispense Refill   albuterol (PROVENTIL HFA;VENTOLIN HFA) 108 (90 BASE) MCG/ACT inhaler Inhale 2 puffs into the lungs every 6 (six) hours as needed for wheezing or shortness of breath.      atorvastatin (LIPITOR) 20 MG tablet Take 20 mg by mouth daily at 6 PM.     Capsaicin 0.1 % CREA Apply topically.     cholecalciferol (VITAMIN D) 1000 units tablet Take 2,000 Units by mouth daily.     diclofenac Sodium (VOLTAREN) 1 % GEL diclofenac 1 % topical gel     fluticasone (FLONASE) 50 MCG/ACT nasal spray Place 2 sprays into both nostrils daily. 16 g 2   ibuprofen (ADVIL) 600 MG tablet Take 1 tablet (600 mg total) by mouth every 8 (eight) hours as needed for up to 30 doses for mild pain  or moderate pain. Take with food 30 tablet 0   lisinopril-hydrochlorothiazide (ZESTORETIC) 10-12.5 MG tablet Take 1 tablet by mouth daily.     metFORMIN (GLUCOPHAGE) 500 MG tablet metformin 500 mg tablet     polyethylene glycol (MIRALAX / GLYCOLAX) packet Take 17 g by mouth daily.     SUMAtriptan (IMITREX) 50 MG tablet Take 50 mg by mouth as needed for migraine. May repeat in 2 hours if headache persists or recurs.     ibuprofen (ADVIL) 800 MG tablet Take by mouth.     montelukast (SINGULAIR) 10 MG tablet      NEOMYCIN-POLYMYXIN-HYDROCORTISONE (CORTISPORIN) 1 % SOLN OTIC solution  SMARTSIG:In Ear(s)     Sod Fluoride-Potassium Nitrate (PREVIDENT 5000 SENSITIVE) 1.1-5 % GEL PreviDent 5000 Sensitive 1.1 %-5 % dental paste     acetaminophen (TYLENOL) 325 MG tablet Take 2 tablets (650 mg total) by mouth every 6 (six) hours as needed for up to 30 doses for moderate pain or fever. 30 tablet 0   amoxicillin (AMOXIL) 500 MG capsule amoxicillin 500 mg capsule     cetirizine (ZYRTEC) 10 MG tablet Take 10 mg by mouth daily.     cyclobenzaprine (FLEXERIL) 10 MG tablet Take 1 tablet (10 mg total) by mouth 2 (two) times daily as needed for up to 15 doses for muscle spasms. 15 tablet 0   docusate sodium (COLACE) 100 MG capsule Take 200 mg by mouth daily.      gabapentin (NEURONTIN) 100 MG capsule Take 100 mg by mouth daily.     hydrochlorothiazide (HYDRODIURIL) 25 MG tablet hydrochlorothiazide 25 mg tablet     topiramate (TOPAMAX) 25 MG tablet Take by mouth.     zolpidem (AMBIEN) 5 MG tablet Take 5 mg by mouth at bedtime as needed for sleep.     No facility-administered medications prior to visit.    Review of Systems  Constitutional:  Negative for chills, diaphoresis, fever, malaise/fatigue and weight loss.  HENT:  Positive for congestion.   Respiratory:  Positive for cough, hemoptysis, sputum production and shortness of breath. Negative for wheezing.   Cardiovascular:  Positive for chest pain and leg swelling. Negative for palpitations.  Gastrointestinal:  Positive for heartburn.  Musculoskeletal:  Positive for joint pain.  Neurological:  Positive for headaches.  Psychiatric/Behavioral:  The patient is nervous/anxious.      Objective:   Vitals:   03/15/22 1539  BP: 118/78  Pulse: 91  SpO2: 99%  Weight: 200 lb 6.4 oz (90.9 kg)  Height: '5\' 4"'$  (1.626 m)   SpO2: 99 % O2 Device: None (Room air)  Physical Exam: General: Well-appearing, no acute distress HENT: [place], AT Eyes: EOMI, no scleral icterus Respiratory: Clear to auscultation bilaterally.  No crackles, wheezing or  rales Cardiovascular: RRR, -M/R/G, no JVD Extremities:-Edema,-tenderness Neuro: AAO x4, CNII-XII grossly intact Psych: Normal mood, normal affect  Data Reviewed:  Imaging: CXR 01/06/22 (report only) - Clear lungs  PFT: 01/09/21 FVC 2.90 (94%) FEV1 2.46 (99%) Ratio 85   Interpretation: Normal spirometry. Normal F-V loop  03/15/22 FVC 2.70 (78%) FEV1 2.34 (87%) Ratio 65. Significant bronchodilator response on FEV1 Interpretation: Mild obstructive defect with significant bronchodilator response   Labs:    Latest Ref Rng & Units 05/09/2016    5:31 AM 04/30/2016    9:27 AM 02/10/2013    5:30 AM  CBC  WBC 4.0 - 10.5 K/uL 9.5  4.9  12.8   Hemoglobin 12.0 - 15.0 g/dL 10.4  12.1  11.3   Hematocrit  36.0 - 46.0 % 30.5  36.4  33.6   Platelets 150 - 400 K/uL 226  266  240    Chronic anemia     Assessment & Plan:   Discussion: 56 year old never smoker with asthma, HTN, DM2, HLD, allergic rhinitis, chronic headaches and OSA/insomnia who presents for cough, fatigue and chest tightness. Symptoms consistent with asthma. Discussed clinical course and management of asthma including bronchodilator regimen and action plan for exacerbation. Will also plan for CT chest to rule out parenchymal abnormalities with environmental exposure risk. Hemoptysis reported is minimal and not frank hemoptysis. This is likely related to trauma from chronic cough. Would not recommend bronchoscopy at this time and clinically monitor after treatment with bronchodilators.  Asthma Asthmatic bronchitis --START Wixela 250-50 ONE puff in the morning and evening. Rinse mouth out after use --CONTINUE Albuterol AS NEEDED for shortness of breath or wheezing  Environmental exposures Chronic cough, shortness of breath --ORDER CT Chest High Resolution to rule out parenchymal abnormalities  Hemoptysis Minimal. Likely traumatic in setting of chronic cough. No indication for bronchoscopy at this point --Management as above  with bronchodilators and CT imaging  Health Maintenance Immunization History  Administered Date(s) Administered   PFIZER(Purple Top)SARS-COV-2 Vaccination 01/08/2020, 01/29/2020   CT Lung Screen - never smoker. Not qualified  Orders Placed This Encounter  Procedures   CT Chest High Resolution    Standing Status:   Future    Standing Expiration Date:   03/16/2023    Scheduling Instructions:     Chronic cough, sand exposure    Order Specific Question:   Is patient pregnant?    Answer:   No    Order Specific Question:   Preferred imaging location?    Answer:   [facility]   Pulmonary function test    Standing Status:   Future    Number of Occurrences:   1    Standing Expiration Date:   03/16/2023    Scheduling Instructions:     Pre post and dlco    Order Specific Question:   Where should this test be performed?    Answer:   [facility]   Meds ordered this encounter  Medications   fluticasone-salmeterol (WIXELA INHUB) 250-50 MCG/ACT AEPB    Sig: Inhale 1 puff into the lungs in the morning and at bedtime.    Dispense:  60 each    Refill:  5    Return in about 1 month (around 04/15/2022).  I have spent a total time of 45-minutes on the day of the appointment reviewing prior documentation, coordinating care and discussing medical diagnosis and plan with the patient/family. Imaging, labs and tests included in this note have been reviewed and interpreted independently by me.  [place], MD [facility] 03/15/2022 4:47 PM  Office Number [phone]

## 2022-03-15 NOTE — Progress Notes (Signed)
Pre/Post Spirometry & DLCO Performed Today.

## 2022-03-15 NOTE — Patient Instructions (Signed)
Asthmatic bronchitis --START Wixela 250-50 ONE puff in the morning and evening. Rinse mouth out after use --CONTINUE Albuterol AS NEEDED for shortness of breath or wheezing  Environmental exposures Chronic cough, shortness of breath --ORDER CT Chest High Resolution to rule out parenchymal abnormalities  Hemoptysis Minimal. Likely traumatic in setting of chronic cough. No indication for bronchoscopy at this point --Management as above with bronchodilators and CT imaging  Follow-up with me in 1 month (January)

## 2022-03-16 ENCOUNTER — Telehealth: Payer: Self-pay | Admitting: Pulmonary Disease

## 2022-03-16 NOTE — Telephone Encounter (Signed)
Called pt twice to inform her about her CT appt - call couldn't be completed unsure if the phone number is incorrect or just not working. Working on authorization the meantime, sending pt letter and [employer]

## 2022-03-28 ENCOUNTER — Encounter (HOSPITAL_BASED_OUTPATIENT_CLINIC_OR_DEPARTMENT_OTHER): Payer: Self-pay

## 2022-03-28 ENCOUNTER — Ambulatory Visit (HOSPITAL_BASED_OUTPATIENT_CLINIC_OR_DEPARTMENT_OTHER)
Admission: RE | Admit: 2022-03-28 | Discharge: 2022-03-28 | Disposition: A | Discharge status: Home / Self Care | Payer: No Typology Code available for payment source | Source: Ambulatory Visit | Attending: Pulmonary Disease | Admitting: Pulmonary Disease

## 2022-03-28 DIAGNOSIS — R059 Cough, unspecified: Secondary | ICD-10-CM | POA: Diagnosis present

## 2022-04-12 ENCOUNTER — Encounter (HOSPITAL_BASED_OUTPATIENT_CLINIC_OR_DEPARTMENT_OTHER): Payer: Self-pay | Admitting: Pulmonary Disease

## 2022-04-12 ENCOUNTER — Ambulatory Visit (INDEPENDENT_AMBULATORY_CARE_PROVIDER_SITE_OTHER): Payer: No Typology Code available for payment source | Admitting: Pulmonary Disease

## 2022-04-12 VITALS — BP 118/70 | HR 87 | Ht 64.0 in | Wt 189.8 lb

## 2022-04-12 DIAGNOSIS — J4530 Mild persistent asthma, uncomplicated: Secondary | ICD-10-CM | POA: Diagnosis not present

## 2022-04-12 NOTE — Patient Instructions (Addendum)
Asthma Asthmatic bronchitis --CONTINUE Wixela 250-50 ONE puff in the morning and evening. Rinse mouth out after use --CONTINUE Albuterol AS NEEDED for shortness of breath or wheezing  Asthma Asthmatic bronchitis --CONTINUE Wixela 250-50 ONE puff in the morning and evening. Rinse mouth out after use --CONTINUE Albuterol AS NEEDED for shortness of breath or wheezing  Environmental exposures Chronic cough, shortness of breath --Reviewed CT scan. No abnormalities  Follow-up with me in 4 months

## 2022-04-12 NOTE — Progress Notes (Signed)
Subjective:   PATIENT ID: Kristen Heath DOB: [birth year], MRN: [id number]  Chief Complaint  Patient presents with   Follow-up    Cough getting better yellow mucus    Reason for Visit: Follow-up  Kristen Heath is a 57 year old never smoker with asthma, HTN, DM2, HLD, allergic rhinitis, chronic headaches and OSA/insomnia who presents for cough, fatigue and chest tightness.  Initial consult Referred by [facility] PCP for persistent cough and blood tingued sputum. Tried on GERD management without improvement. NL PFTs, CXR. Question for bronchoscopy.  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 Since 2015 she has had chronic cough and chest tightness. Was seen by [facility] pulmonologist and prescribed albuterol and singulair for cough variant asthma. May have been on maintenance inhalers but unable to recall. Used her daughter's nebulizer.  Her symptoms of productive cough, shortness of breath and wheezing are unchanged. Unable to tolerate aspirin. Denies personal or family history of asthma. She does have some mild hemoptysis described as blood mixed in sputum that only occurs in the morning. Denies frank hemoptysis. Has abdominal pain due to frequent coughing.  04/12/22 On our last visit she started on [place]. Less coughing and wheezing at night but still having persistent symptoms during the day. No further hemoptysis. Not needing rescue inhaler. She was starting to do stationary bike but waiting on knee braces from [place].  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 No pertinent family history.   Social History   Occupational History   Not on file  Tobacco Use   Smoking status: Never   Smokeless tobacco: Never  Substance and Sexual Activity   Alcohol use: Yes    Comment: occ   Drug use: No   Sexual activity: Not Currently    Birth control/protection: None    Allergies  Allergen Reactions   Modafinil Rash   Niacin And Related Other (See Comments)   Niacin Other (See Comments) and Rash     Outpatient Medications Prior to Visit  Medication Sig Dispense Refill   albuterol (PROVENTIL HFA;VENTOLIN HFA) 108 (90 BASE) MCG/ACT inhaler Inhale 2 puffs into the lungs every 6 (six) hours as needed for wheezing or shortness of breath.      atorvastatin (LIPITOR) 20 MG tablet Take 20 mg by mouth daily at 6 PM.     Capsaicin 0.1 % CREA Apply topically.     cholecalciferol (VITAMIN D) 1000 units tablet Take 2,000 Units by mouth daily.     diclofenac Sodium (VOLTAREN) 1 % GEL diclofenac 1 % topical gel     fluticasone (FLONASE) 50 MCG/ACT  nasal spray Place 2 sprays into both nostrils daily. 16 g 2   fluticasone-salmeterol (WIXELA INHUB) 250-50 MCG/ACT AEPB Inhale 1 puff into the lungs in the morning and at bedtime. 60 each 5   ibuprofen (ADVIL) 600 MG tablet Take 1 tablet (600 mg total) by mouth every 8 (eight) hours as needed for up to 30 doses for mild pain or moderate pain. Take with food 30 tablet 0   lisinopril-hydrochlorothiazide (ZESTORETIC) 10-12.5 MG tablet Take 1 tablet by mouth daily.     metFORMIN (GLUCOPHAGE) 500 MG tablet metformin 500 mg tablet     polyethylene glycol (MIRALAX / GLYCOLAX) packet Take 17 g  by mouth daily.     SUMAtriptan (IMITREX) 50 MG tablet Take 50 mg by mouth as needed for migraine. May repeat in 2 hours if headache persists or recurs.     ibuprofen (ADVIL) 800 MG tablet Take by mouth.     montelukast (SINGULAIR) 10 MG tablet      NEOMYCIN-POLYMYXIN-HYDROCORTISONE (CORTISPORIN) 1 % SOLN OTIC solution SMARTSIG:In Ear(s)     Sod Fluoride-Potassium Nitrate (PREVIDENT 5000 SENSITIVE) 1.1-5 % GEL PreviDent 5000 Sensitive 1.1 %-5 % dental paste     No facility-administered medications prior to visit.    Review of Systems  Constitutional:  Negative for chills, diaphoresis, fever, malaise/fatigue and weight loss.  HENT:  Negative for congestion.   Respiratory:  Positive for cough and shortness of breath. Negative for hemoptysis, sputum production and wheezing.   Cardiovascular:  Negative for chest pain, palpitations and leg swelling.     Objective:   Vitals:   04/12/22 1522  BP: 118/70  Pulse: 87  SpO2: 98%  Weight: 189 lb 12.8 oz (86.1 kg)  Height: '5\' 4"'$  (1.626 m)   SpO2: 98 % O2 Device: None (Room air)  Physical Exam: General: Well-appearing, no acute distress HENT: [place], AT Eyes: EOMI, no scleral icterus Respiratory: Clear to auscultation bilaterally.  No crackles, wheezing or rales Cardiovascular: RRR, -M/R/G, no JVD Extremities:-Edema,-tenderness Neuro: AAO x4, CNII-XII grossly intact Psych: Normal mood, normal affect  Data Reviewed:  Imaging: CXR 01/06/22 (report only) - Clear lungs CT Chest 03/28/22 - Mild air trapping. No interstitial changes suggestive of ILD  PFT: 01/09/21 FVC 2.90 (94%) FEV1 2.46 (99%) Ratio 85   Interpretation: Normal spirometry. Normal F-V loop  03/15/22 FVC 2.70 (78%) FEV1 2.34 (87%) Ratio 65. Significant bronchodilator response on FEV1 Interpretation: Mild obstructive defect with significant bronchodilator response   Labs:    Latest Ref Rng & Units 05/09/2016    5:31 AM 04/30/2016    9:27 AM 02/10/2013    5:30 AM  CBC   WBC 4.0 - 10.5 K/uL 9.5  4.9  12.8   Hemoglobin 12.0 - 15.0 g/dL 10.4  12.1  11.3   Hematocrit 36.0 - 46.0 % 30.5  36.4  33.6   Platelets 150 - 400 K/uL 226  266  240    Chronic anemia     Assessment & Plan:   Discussion: 57 year old never smoker with asthma, HTN, DM2, HLD, allergic rhinitis, chronic headaches and OSA/insomnia who presents for asthma follow-up PFTs consistent with asthma. CT reviewed with no evidence of parenchymal lung disease. Improving symptoms on medium ICS/LABA. Will inhaler more time to work before stepping up since she still has persistent symptoms. Discussed clinical course and management of asthma including bronchodilator regimen and action plan for exacerbation.   Mild persistent asthma --CONTINUE Wixela 250-50 ONE puff in the morning and evening. Rinse mouth  out after use --CONTINUE Albuterol AS NEEDED for shortness of breath or wheezing  Environmental exposures Chronic cough, shortness of breath --Reviewed CT scan. No abnormalities  Hemoptysis -resolved Minimal. Likely traumatic in setting of chronic cough. No indication for bronchoscopy at this point --Management as above with bronchodilators and CT imaging  Health Maintenance Immunization History  Administered Date(s) Administered   PFIZER(Purple Top)SARS-COV-2 Vaccination 01/08/2020, 01/29/2020   CT Lung Screen - never smoker. Not qualified  No orders of the defined types were placed in this encounter.  No orders of the defined types were placed in this encounter.   Return in about 4 months (around 08/11/2022).  I have spent a total time of 35-minutes on the day of the appointment including chart review, data review, collecting history, coordinating care and discussing medical diagnosis and plan with the patient/family. Past medical history, allergies, medications were reviewed. Pertinent imaging, labs and tests included in this note have been reviewed and interpreted independently by  me.  [place], MD [facility] 04/12/2022 3:37 PM  Office Number [phone]

## 2022-04-13 ENCOUNTER — Encounter (HOSPITAL_BASED_OUTPATIENT_CLINIC_OR_DEPARTMENT_OTHER): Payer: Self-pay | Admitting: Pulmonary Disease

## 2022-07-12 ENCOUNTER — Encounter (HOSPITAL_BASED_OUTPATIENT_CLINIC_OR_DEPARTMENT_OTHER): Payer: Self-pay | Admitting: Pulmonary Disease

## 2022-07-12 ENCOUNTER — Ambulatory Visit (INDEPENDENT_AMBULATORY_CARE_PROVIDER_SITE_OTHER): Payer: No Typology Code available for payment source | Admitting: Pulmonary Disease

## 2022-07-12 VITALS — BP 130/70 | HR 91 | Ht 64.0 in | Wt 207.2 lb

## 2022-07-12 DIAGNOSIS — J4531 Mild persistent asthma with (acute) exacerbation: Secondary | ICD-10-CM | POA: Diagnosis not present

## 2022-07-12 MED ORDER — AMOXICILLIN-POT CLAVULANATE 875-125 MG PO TABS: 1.0000 | ORAL_TABLET | Freq: Two times a day (BID) | ORAL | 0 refills | Status: DC

## 2022-07-12 MED ORDER — ALBUTEROL SULFATE 0.63 MG/3ML IN NEBU: 1.0000 | INHALATION_SOLUTION | Freq: Four times a day (QID) | RESPIRATORY_TRACT | 12 refills | Status: AC | PRN

## 2022-07-12 MED ORDER — PREDNISONE 10 MG PO TABS: ORAL_TABLET | ORAL | 0 refills | Status: AC

## 2022-07-12 NOTE — Patient Instructions (Signed)
Mild persistent asthma with exacerbation Acute bacterial sinusitis --START Augmentin x 7 days --START prednisone taper as directed --CONTINUE Wixela 250-50 ONE puff in the morning and evening. Rinse mouth out after use --CONTINUE Albuterol AS NEEDED for shortness of breath or wheezing --Will order albuterol nebulizer --ORDER nebulizer through [place]

## 2022-07-12 NOTE — Progress Notes (Signed)
Subjective:   PATIENT ID: Kristen Heath GENDER: female DOB: [birth year], MRN: [id number]  Chief Complaint  Patient presents with   Follow-up    Refill on singular Needs neb machine and humidifier     Reason for Visit: Follow-up  Ms. Kristen Heath is a 57 year old never smoker with asthma, HTN, DM2, HLD, allergic rhinitis, chronic headaches and OSA/insomnia who presents for asthma follow-up.  Initial consult Referred by [facility] PCP for persistent cough and blood tingued sputum. Tried on GERD management without improvement. NL PFTs, CXR. Question for bronchoscopy.  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 Since 2015 she has had chronic cough and chest tightness. Was [facility] pulmonologist and prescribed albuterol and singulair for cough variant asthma. May have been on maintenance inhalers but unable to recall. Used her daughter's nebulizer.  Her symptoms of productive cough, shortness of breath and wheezing are unchanged. Unable to tolerate aspirin. Denies personal or family history of asthma. She does have some mild hemoptysis described as blood mixed in sputum that only occurs in the morning. Denies frank hemoptysis. Has abdominal pain due to frequent coughing.  04/12/22 On our last visit she started on Wixela. Less coughing and wheezing at night but still having persistent symptoms during the day. No further hemoptysis. Not needing rescue inhaler. She was starting to do stationary bike but waiting on knee braces from [place].  07/12/22 Since the last visit she reports she continues to have productive cough worse in the evening and still during the day. Associated with purulent nasal congestion x 1 week. Worsened with pollen. Does have some dark bloody streaked sputum. Not exercising as much due to this. She is also experiencing a migraine with  light sensitivity. Reports some chills. Denies fever. Compliant with Wixela. Using Wixela 3-4 x a week.  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 No pertinent family history.   Social History   Occupational History   Not on file  Tobacco Use   Smoking status: Never   Smokeless tobacco: Never  Substance and Sexual Activity   Alcohol use: Yes    Comment: occ   Drug use: No   Sexual activity: Not Currently    Birth control/protection: None    Allergies  Allergen Reactions   Modafinil Rash   Niacin And Related Other (See Comments)   Niacin Other (See Comments) and Rash     Outpatient Medications Prior to Visit  Medication Sig Dispense Refill   albuterol (PROVENTIL HFA;VENTOLIN HFA) 108 (90 BASE) MCG/ACT  inhaler Inhale 2 puffs into the lungs every 6 (six) hours as needed for wheezing or shortness of breath.      atorvastatin (LIPITOR) 20 MG tablet Take 20 mg by mouth daily at 6 PM.     Capsaicin 0.1 % CREA Apply topically.     cholecalciferol (VITAMIN D) 1000 units tablet Take 2,000 Units by mouth daily.     diclofenac Sodium (VOLTAREN) 1 % GEL diclofenac 1 % topical gel     fluticasone (FLONASE) 50 MCG/ACT nasal spray Place 2 sprays into both nostrils daily. 16 g 2   fluticasone-salmeterol (WIXELA  INHUB) 250-50 MCG/ACT AEPB Inhale 1 puff into the lungs in the morning and at bedtime. 60 each 5   ibuprofen (ADVIL) 600 MG tablet Take 1 tablet (600 mg total) by mouth every 8 (eight) hours as needed for up to 30 doses for mild pain or moderate pain. Take with food 30 tablet 0   lisinopril-hydrochlorothiazide (ZESTORETIC) 10-12.5 MG tablet Take 1 tablet by mouth daily.     metFORMIN (GLUCOPHAGE) 500 MG tablet metformin 500 mg tablet     montelukast (SINGULAIR) 10 MG tablet      polyethylene glycol (MIRALAX / GLYCOLAX) packet Take 17 g by mouth daily.     SUMAtriptan (IMITREX) 50 MG tablet Take 50 mg by mouth as needed for migraine. May repeat in 2 hours if headache persists or recurs.     ibuprofen (ADVIL) 800 MG tablet Take by mouth.     NEOMYCIN-POLYMYXIN-HYDROCORTISONE (CORTISPORIN) 1 % SOLN OTIC solution SMARTSIG:In Ear(s)     Sod Fluoride-Potassium Nitrate (PREVIDENT 5000 SENSITIVE) 1.1-5 % GEL PreviDent 5000 Sensitive 1.1 %-5 % dental paste     No facility-administered medications prior to visit.    Review of Systems  Constitutional:  Negative for chills, diaphoresis, fever, malaise/fatigue and weight loss.  HENT:  Positive for congestion.   Respiratory:  Positive for cough, sputum production and shortness of breath. Negative for hemoptysis and wheezing.   Cardiovascular:  Negative for chest pain, palpitations and leg swelling.     Objective:   Vitals:   07/12/22 1523  BP: 130/70  Pulse: 91  SpO2: 97%  Weight: 207 lb 3.2 oz (94 kg)  Height: 5\' 4"  (1.626 m)   SpO2: 97 % O2 Device: None (Room air)  Physical Exam: General: Well-appearing, no acute distress HENT: [place], AT Eyes: EOMI, no scleral icterus Respiratory: Clear to auscultation bilaterally.  No crackles, wheezing or rales Cardiovascular: RRR, -M/R/G, no JVD Extremities:-Edema,-tenderness Neuro: AAO x4, CNII-XII grossly intact Psych: Normal mood, normal affect   Data Reviewed:  Imaging: CXR 01/06/22 (report  only) - Clear lungs CT Chest 03/28/22 - Mild air trapping. No interstitial changes suggestive of ILD  PFT: 01/09/21 FVC 2.90 (94%) FEV1 2.46 (99%) Ratio 85   Interpretation: Normal spirometry. Normal F-V loop  03/15/22 FVC 2.70 (78%) FEV1 2.34 (87%) Ratio 65. Significant bronchodilator response on FEV1 Interpretation: Mild obstructive defect with significant bronchodilator response   Labs:    Latest Ref Rng & Units 05/09/2016    5:31 AM 04/30/2016    9:27 AM 02/10/2013    5:30 AM  CBC  WBC 4.0 - 10.5 K/uL 9.5  4.9  12.8   Hemoglobin 12.0 - 15.0 g/dL 16.1  09.6  04.5   Hematocrit 36.0 - 46.0 % 30.5  36.4  33.6   Platelets 150 - 400 K/uL 226  266  240    Chronic anemia     Assessment & Plan:  Discussion: 57 year old never smoker with asthma, HTN, DM2, HLD, allergic rhinitis, chronic headaches and OSA/insomnia who presents for asthma follow-up PFTs consistent with asthma. CT reviewed with no evidence of parenchymal lung disease. Improving symptoms on medium ICS/LABA however currently in exacerbation. Will inhaler more time to work before stepping up since she still has persistent symptoms. Discussed clinical course and management of asthma including bronchodilator regimen and action plan for exacerbation.   Mild persistent asthma with exacerbation Acute bacterial sinusitis --START Augmentin x 7 days --START prednisone taper as directed --CONTINUE Wixela 250-50 ONE puff in the morning and evening. Rinse mouth out after use --CONTINUE Albuterol AS NEEDED for shortness of breath or wheezing --Will order albuterol nebulizer --ORDER nebulizer through [place]  Environmental exposures Chronic cough, shortness of breath --Reviewed CT scan. No abnormalities  Hemoptysis -2/2 to infection Minimal. Likely traumatic in setting of chronic cough. No indication for bronchoscopy at this point --Management as above with bronchodilators/antibiotics/prednisone  Health Maintenance Immunization  History  Administered Date(s) Administered   Anthrax 04/16/2002, 05/04/2002, 05/30/2002, 06/05/2002, 08/07/2004, 06/01/2005, 01/23/2010   H1N1 03/26/2008   Hep A / Hep B 04/16/2002   Hepatitis A, Adult 04/28/1999, 08/07/2004   Hepatitis B, ADULT 05/27/2003, 07/24/2004   IPV 04/28/1999   Influenza Inj Mdck Quad Pf 01/31/2021   Influenza Split 12/29/2010   Influenza, High Dose Seasonal PF 11/25/2011   Influenza, Seasonal, Injecte, Preservative Fre 12/11/2011, 01/26/2012, 12/16/2012, 12/25/2012, 12/24/2013, 12/23/2014   Influenza,inj,Quad PF,6+ Mos 01/20/2016, 12/25/2016, 01/01/2019, 05/17/2020   Influenza-Unspecified 01/10/2007, 12/06/2007, 04/15/2009, 12/28/2009, 12/29/2010, 05/17/2020, 01/31/2021   JAPANESE ENCEPHALITIS IM 10/30/2013   Measles / Rubella 04/28/1999   Meningococcal Acwy, Unspecified 07/04/2006   Meningococcal Conjugate 07/04/2006   Meningococcal polysaccharide vaccine (MPSV4) 04/28/1999   Novel Infuenza-h1n1-09 04/03/2008   PFIZER(Purple Top)SARS-COV-2 Vaccination 01/08/2020, 01/29/2020   PNEUMOCOCCAL CONJUGATE-20 01/31/2021   Pneumococcal Polysaccharide-23 12/24/2013, 06/24/2017   Smallpox 05/04/2002   Td 04/16/2002, 08/29/2021   Tdap 04/15/2009, 12/11/2011   Typhoid Inactivated 04/16/2002, 08/07/2004, 07/04/2006, 09/02/2009, 10/30/2013   Unspecified SARS-COV-2 Vaccination 01/29/2020   Zoster Recombinat (Shingrix) 05/21/2017   Zoster, Live 11/24/2017   CT Lung Screen - never smoker. Not qualified  Orders Placed This Encounter  Procedures   Ambulatory Referral for DME    Referral Priority:   Routine    Referral Type:   Durable Medical Equipment Purchase    Number of Visits Requested:   1   Meds ordered this encounter  Medications   predniSONE (DELTASONE) 10 MG tablet    Sig: Take 4 tablets (40 mg total) by mouth daily with breakfast for 2 days, THEN 3 tablets (30 mg total) daily with breakfast for 2 days, THEN 2 tablets (20 mg total) daily with breakfast for  2 days, THEN 1 tablet (10 mg total) daily with breakfast for 2 days.    Dispense:  20 tablet    Refill:  0   amoxicillin-clavulanate (AUGMENTIN) 875-125 MG tablet    Sig: Take 1 tablet by mouth 2 (two) times daily.    Dispense:  14 tablet    Refill:  0   albuterol (ACCUNEB) 0.63 MG/3ML nebulizer solution    Sig: Take 3 mLs (0.63 mg total) by nebulization every 6 (six) hours as needed for wheezing.    Dispense:  75 mL    Refill:  12    Return in about 3 months (around 10/11/2022).  I have spent a total time of35-minutes on the day of the appointment including chart review, data review, collecting  history, coordinating care and discussing medical diagnosis and plan with the patient/family. Past medical history, allergies, medications were reviewed. Pertinent imaging, labs and tests included in this note have been reviewed and interpreted independently by me.  Marily Konczal Mechele Collin, MD [facility] 07/12/2022 3:32 PM  Office Number [phone]

## 2022-07-14 ENCOUNTER — Encounter (HOSPITAL_BASED_OUTPATIENT_CLINIC_OR_DEPARTMENT_OTHER): Payer: Self-pay | Admitting: Pulmonary Disease

## 2022-07-31 ENCOUNTER — Encounter (HOSPITAL_BASED_OUTPATIENT_CLINIC_OR_DEPARTMENT_OTHER): Payer: Self-pay | Admitting: Pulmonary Disease

## 2022-07-31 NOTE — Telephone Encounter (Signed)
Please resend DME order to the [facility] for the nebulizer and humidifier   Please also print out a copy of confirmed successful fax from 07/13/22 and mail it to patient's home address

## 2022-08-03 NOTE — Telephone Encounter (Signed)
Luciano Cutter, MD3 days ago    Please resend DME order to the [facility] for the nebulizer and humidifier    Please also print out a copy of confirmed successful fax from 07/13/22 and mail it to patient's home address      Note   Chi Mechele Collin, MD  Sheilah Mins "[place] days ago    As we discussed on the phone, the fax for the nebulizer and humidifier was successfully sent on 07/13/22. I will have staff re-send the order and also print out a copy of our confirmed fax and mail it to your home address.

## 2022-08-03 NOTE — Telephone Encounter (Signed)
Fax confirmation and order have been placed in mail for pt.

## 2022-08-08 NOTE — Telephone Encounter (Signed)
Patient requesting DME order to be printed and also copy of fax confirmation to be printed for daughter to pick up on 08/09/22. Please place in envelope to be picked up at front desk.

## 2022-08-28 NOTE — Telephone Encounter (Signed)
Mychart message received by pt:  Kristen Heath "Kristen Heath"  P [facility] (supporting Chi Mechele Collin, MD)6 days ago   JG Good afternoon, Dr. Everardo All & Staff,   I want to thank you and your staff for all your help.  At this time I am still fight through [place] red tape to get the Nebulizer/Air Purifier-Humidifier.  [facility] (this afternoon), they provided me with the following fax number [phone], request that your office  fax the prescription to it.   Please let me know if I need to do anything.     Respectfully,   Kristen Heath [phone]    Routing to [place] for review.

## 2022-09-20 ENCOUNTER — Ambulatory Visit
Admission: EM | Admit: 2022-09-20 | Discharge: 2022-09-20 | Disposition: A | Discharge status: Home / Self Care | Payer: Federal, State, Local not specified - PPO | Attending: Family Medicine | Admitting: Family Medicine

## 2022-09-20 DIAGNOSIS — R42 Dizziness and giddiness: Secondary | ICD-10-CM | POA: Diagnosis not present

## 2022-09-20 DIAGNOSIS — R110 Nausea: Secondary | ICD-10-CM | POA: Diagnosis not present

## 2022-09-20 LAB — POCT FASTING CBG KUC MANUAL ENTRY: POCT Glucose (KUC): 122 mg/dL — AB (ref 70–99)

## 2022-09-20 MED ORDER — ONDANSETRON 4 MG PO TBDP: 4.0000 mg | ORAL_TABLET | Freq: Three times a day (TID) | ORAL | 0 refills | Status: AC | PRN

## 2022-09-20 NOTE — ED Triage Notes (Signed)
Patient here today after having a fall an hour ago. Patient states that she felt dizzy and nauseous. Both her knees buckled and she fell forward and bumped the front right side of her head. She did not pass out. She has a h/o sciatica.

## 2022-09-20 NOTE — Discharge Instructions (Signed)
Your sugar on fingerstick was 122; it is possible your sugar was lower when you felt so dizzy.  Ondansetron dissolved in the mouth every 8 hours as needed for nausea or vomiting. Clear liquids(water, gatorade/pedialyte, ginger ale/sprite, chicken broth/soup) and bland things(crackers/toast, rice, potato, bananas) to eat. Avoid acidic foods like lemon/lime/orange/tomato, and avoid greasy/spicy foods.  Have drawn blood to check your blood counts and your sodium and potassium and kidney function numbers.  Staff will notify you if there is anything significantly abnormal. Make sure you are drinking enough fluids and make sure you are eating enough calories since you are taking medication to lower your sugar.  Let your primary care know this happen please  If you worsen again in any way, please consider going to the emergency room for evaluation

## 2022-09-20 NOTE — ED Provider Notes (Signed)
[facility]    CSN: [id number] Arrival date & time: 09/20/22  1602      History   Chief Complaint Chief Complaint  Patient presents with   Fall    HPI Kristen Heath is a 57 y.o. female.    Fall   Dizziness and nausea and a fall.  Today about 3 or 3:30 in the afternoon she felt dizzy and nauseated and fell forward.  She did not pass out, and there is no loss of consciousness.  She fell forward and hit her head on the sofa.  Her knees and right hip are bothering her a little bit.  She was able to ambulate into the clinic with the help of a cane which she uses most of the time.  No recent vomiting or diarrhea.  No fever or chills.  She does have chronic cough and congestion and sometimes some tightness with her asthma.  This is not worsened in the last 24 to 48 hours.  No dysuria and no hematuria.  She does have chronic back pain She does have diabetes and takes medication to lower her sugar.  She had eaten a bowl of soup and some watermelon about an hour before this incident occurred.  And she has hypertension and she is on lisinopril HCT  She is here in the clinic about 1 hour after this happened.  She was unable to check her sugar when this happened.    Past Medical History:  Diagnosis Date   Anemia    Anxiety    no current meds   Arthritis    knees   Asthma    cough induced asthma   CPAP (continuous positive airway pressure) dependence    Dry skin    Gestational diabetes    Headache(784.0)    Migraines   History of kidney stones    no surgery - passed stone   Hyperlipidemia    Hypertension    Pre-diabetes    PTSD (post-traumatic stress disorder)    Seasonal allergies    Sleep apnea    recent diagnosis 01/18/13     Patient Active Problem List   Diagnosis Date Noted   Cough 03/15/2022   Allergy 02/10/2020   Anemia 02/10/2020   Hyperlipidemia associated with type 2 diabetes mellitus (HCC) 02/10/2020   Knee pain 02/10/2020    Migraines 02/10/2020   OSA on CPAP 02/10/2020   Severe obesity (BMI 35.0-39.9) with comorbidity (HCC) 05/11/2019   Mild persistent asthma without complication 03/17/2019   Low back pain 05/30/2018   Type 2 diabetes mellitus without complication, without long-term current use of insulin (HCC) 06/23/2017   S/P bilateral salpingo-oophorectomy 05/08/2016   Pelvic pain 02/10/2013    Class: Hospitalized for   Abnormal uterine bleeding 02/10/2013    Class: Hospitalized for   Status post abdominal hysterectomy 02/10/2013    Class: Status post    Past Surgical History:  Procedure Laterality Date   ABDOMINAL HYSTERECTOMY N/A 02/09/2013   Procedure: HYSTERECTOMY ABDOMINAL;  Surgeon: Juluis Mire, MD;  Location: [facility];  Service: Gynecology;  Laterality: N/A;   arthroscopic right knee      CESAREAN SECTION  1993   COLONOSCOPY     CYSTOSCOPY N/A 05/08/2016   Procedure: CYSTOSCOPY;  Surgeon: Richardean Chimera, MD;  Location: [facility];  Service: Gynecology;  Laterality: N/A;   LAPAROSCOPIC SALPINGO OOPHERECTOMY Bilateral 05/08/2016   Procedure: ATTEMPTED LAPAROSCOPIC SALPINGO OOPHORECTOMY;  Surgeon: Richardean Chimera, MD;  Location: [facility];  Service: Gynecology;  Laterality: Bilateral;  Neal Dy RNFA confirmed 04/16/16   MYOMECTOMY ABDOMINAL APPROACH     SALPINGOOPHORECTOMY  05/08/2016   Procedure: BILATERAL SALPINGO OOPHORECTOMY,OPEN;  Surgeon: Richardean Chimera, MD;  Location: [facility];  Service: Gynecology;;   UNILATERAL SALPINGECTOMY Right 02/09/2013   Procedure: UNILATERAL SALPINGECTOMY;  Surgeon: Juluis Mire, MD;  Location: [facility];  Service: Gynecology;  Laterality: Right;   WISDOM TOOTH EXTRACTION      OB History   No obstetric history on file.      Home Medications    Prior to Admission medications   Medication Sig Start Date End Date Taking? Authorizing Provider  ondansetron (ZOFRAN-ODT) 4 MG disintegrating tablet Take 1 tablet (4 mg total) by mouth every 8 (eight) hours as needed for nausea or  vomiting. 09/20/22  Yes Rahmel Nedved, Janace Aris, MD  albuterol (ACCUNEB) 0.63 MG/3ML nebulizer solution Take 3 mLs (0.63 mg total) by nebulization every 6 (six) hours as needed for wheezing. 07/12/22   Luciano Cutter, MD  albuterol (PROVENTIL HFA;VENTOLIN HFA) 108 (90 BASE) MCG/ACT inhaler Inhale 2 puffs into the lungs every 6 (six) hours as needed for wheezing or shortness of breath.     [provider]  atorvastatin (LIPITOR) 20 MG tablet Take 20 mg by mouth daily at 6 PM.    [provider]  Capsaicin 0.1 % CREA Apply topically.    [provider]  cholecalciferol (VITAMIN D) 1000 units tablet Take 2,000 Units by mouth daily.    [provider]  diclofenac Sodium (VOLTAREN) 1 % GEL diclofenac 1 % topical gel    [provider]  fluticasone (FLONASE) 50 MCG/ACT nasal spray Place 2 sprays into both nostrils daily. 03/28/14   Everlene Farrier, PA-C  fluticasone-salmeterol (WIXELA INHUB) 250-50 MCG/ACT AEPB Inhale 1 puff into the lungs in the morning and at bedtime. 03/15/22   Luciano Cutter, MD  lisinopril-hydrochlorothiazide (ZESTORETIC) 10-12.5 MG tablet Take 1 tablet by mouth daily. 12/17/17   [provider]  metFORMIN (GLUCOPHAGE) 500 MG tablet metformin 500 mg tablet    [provider]  montelukast (SINGULAIR) 10 MG tablet  01/13/20   [provider]  NEOMYCIN-POLYMYXIN-HYDROCORTISONE (CORTISPORIN) 1 % SOLN OTIC solution SMARTSIG:In Ear(s) 12/16/19   [provider]  polyethylene glycol (MIRALAX / GLYCOLAX) packet Take 17 g by mouth daily.    [provider]  Sod Fluoride-Potassium Nitrate (PREVIDENT 5000 SENSITIVE) 1.1-5 % GEL PreviDent 5000 Sensitive 1.1 %-5 % dental paste    [provider]  SUMAtriptan (IMITREX) 50 MG tablet Take 50 mg by mouth as needed for migraine. May repeat in 2 hours if headache persists or recurs.    [provider]    Family History History reviewed. No pertinent  family history.  Social History Social History   Tobacco Use   Smoking status: Never   Smokeless tobacco: Never  Substance Use Topics   Alcohol use: Yes    Comment: occ   Drug use: No     Allergies   Modafinil, Niacin and related, and Niacin   Review of Systems Review of Systems   Physical Exam Triage Vital Signs ED Triage Vitals  Enc Vitals Group     BP 09/20/22 1613 134/81     Pulse Rate 09/20/22 1613 83     Resp 09/20/22 1613 17     Temp 09/20/22 1613 99 F (37.2 C)     Temp Source 09/20/22 1613 Oral     SpO2 09/20/22 1613 96 %  Weight 09/20/22 1613 196 lb (88.9 kg)     Height 09/20/22 1613 5\' 4"  (1.626 m)     Head Circumference --      Peak Flow --      Pain Score 09/20/22 1612 10     Pain Loc --      Pain Edu? --      Excl. in GC? --    No data found.  Updated Vital Signs BP 134/81 (BP Location: Left Arm)   Pulse 83   Temp 99 F (37.2 C) (Oral)   Resp 17   Ht 5\' 4"  (1.626 m)   Wt 88.9 kg   LMP 01/22/2013   SpO2 96%   BMI 33.64 kg/m   Visual Acuity Right Eye Distance:   Left Eye Distance:   Bilateral Distance:    Right Eye Near:   Left Eye Near:    Bilateral Near:     Physical Exam Vitals reviewed.  Constitutional:      General: She is not in acute distress.    Appearance: She is not toxic-appearing.  HENT:     Head:     Comments: There is an area of mild soft tissue swelling about 3 cm in diameter on her right frontal area.  There is no laceration or abrasion    Nose: Nose normal.     Mouth/Throat:     Mouth: Mucous membranes are moist.     Pharynx: No oropharyngeal exudate or posterior oropharyngeal erythema.  Eyes:     Extraocular Movements: Extraocular movements intact.     Conjunctiva/sclera: Conjunctivae normal.     Pupils: Pupils are equal, round, and reactive to light.  Cardiovascular:     Rate and Rhythm: Normal rate and regular rhythm.     Heart sounds: No murmur heard. Pulmonary:     Effort: Pulmonary effort is  normal. No respiratory distress.     Breath sounds: No stridor. No wheezing, rhonchi or rales.  Musculoskeletal:        General: No deformity.     Cervical back: Neck supple.  Lymphadenopathy:     Cervical: No cervical adenopathy.  Skin:    Capillary Refill: Capillary refill takes less than 2 seconds.     Coloration: Skin is not jaundiced or pale.  Neurological:     General: No focal deficit present.     Mental Status: She is alert and oriented to person, place, and time.  Psychiatric:        Behavior: Behavior normal.      UC Treatments / Results  Labs (all labs ordered are listed, but only abnormal results are displayed) Labs Reviewed  POCT FASTING CBG [facility] MANUAL ENTRY - Abnormal; Notable for the following components:      Result Value   POCT Glucose ([facility]) 122 (*)    All other components within normal limits  CBC  BASIC METABOLIC PANEL    EKG   Radiology No results found.  Procedures Procedures (including critical care time)  Medications Ordered in UC Medications - No data to display  Initial Impression / Assessment and Plan / UC Course  I have reviewed the triage vital signs and the nursing notes.  Pertinent labs & imaging results that were available during my care of the patient were reviewed by me and considered in my medical decision making (see chart for details).        Fingerstick glucose is 122.  CBC and BMP are drawn today.  Staff will notify  her if there is anything significantly abnormal.  Zofran is sent in for the nausea.  She is to proceed to the emergency room if she worsens in any way. Final Clinical Impressions(s) / UC Diagnoses   Final diagnoses:  Dizziness  Nausea     Discharge Instructions      Your sugar on fingerstick was 122; it is possible your sugar was lower when you felt so dizzy.  Ondansetron dissolved in the mouth every 8 hours as needed for nausea or vomiting. Clear liquids(water, gatorade/pedialyte, ginger  ale/sprite, chicken broth/soup) and bland things(crackers/toast, rice, potato, bananas) to eat. Avoid acidic foods like lemon/lime/orange/tomato, and avoid greasy/spicy foods.  Have drawn blood to check your blood counts and your sodium and potassium and kidney function numbers.  Staff will notify you if there is anything significantly abnormal. Make sure you are drinking enough fluids and make sure you are eating enough calories since you are taking medication to lower your sugar.  Let your primary care know this happen please  If you worsen again in any way, please consider going to the emergency room for evaluation     ED Prescriptions     Medication Sig Dispense Auth. Provider   ondansetron (ZOFRAN-ODT) 4 MG disintegrating tablet Take 1 tablet (4 mg total) by mouth every 8 (eight) hours as needed for nausea or vomiting. 10 tablet Marlinda Mike Janace Aris, MD      PDMP not reviewed this encounter.   Zenia Resides, MD 09/20/22 [phone]

## 2022-09-21 LAB — BASIC METABOLIC PANEL
BUN/Creatinine Ratio: 20 (ref 9–23)
BUN: 17 mg/dL (ref 6–24)
CO2: 27 mmol/L (ref 20–29)
Calcium: 10.5 mg/dL — ABNORMAL HIGH (ref 8.7–10.2)
Chloride: 102 mmol/L (ref 96–106)
Creatinine, Ser: 0.85 mg/dL (ref 0.57–1.00)
Glucose: 105 mg/dL — ABNORMAL HIGH (ref 70–99)
Potassium: 4.1 mmol/L (ref 3.5–5.2)
Sodium: 142 mmol/L (ref 134–144)
eGFR: 80 mL/min/{1.73_m2} (ref >59)

## 2022-09-21 LAB — CBC
Hematocrit: 38.2 % (ref 34.0–46.6)
Hemoglobin: 12.5 g/dL (ref 11.1–15.9)
MCH: 27.1 pg (ref 26.6–33.0)
MCHC: 32.7 g/dL (ref 31.5–35.7)
MCV: 83 fL (ref 79–97)
Platelets: 260 10*3/uL (ref 150–450)
RBC: 4.61 x10E6/uL (ref 3.77–5.28)
RDW: 13.7 % (ref 11.7–15.4)
WBC: 5.6 10*3/uL (ref 3.4–10.8)

## 2022-12-10 ENCOUNTER — Ambulatory Visit
Admission: EM | Admit: 2022-12-10 | Discharge: 2022-12-10 | Disposition: A | Discharge status: Home / Self Care | Payer: Federal, State, Local not specified - PPO

## 2022-12-10 DIAGNOSIS — M545 Low back pain, unspecified: Secondary | ICD-10-CM

## 2022-12-10 DIAGNOSIS — T148XXA Other injury of unspecified body region, initial encounter: Secondary | ICD-10-CM

## 2022-12-10 DIAGNOSIS — M25511 Pain in right shoulder: Secondary | ICD-10-CM

## 2022-12-10 DIAGNOSIS — M542 Cervicalgia: Secondary | ICD-10-CM

## 2022-12-10 DIAGNOSIS — M25512 Pain in left shoulder: Secondary | ICD-10-CM

## 2022-12-10 MED ORDER — METHOCARBAMOL 500 MG PO TABS: 500.0000 mg | ORAL_TABLET | Freq: Two times a day (BID) | ORAL | 0 refills | Status: AC | PRN
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
## 2022-12-13 ENCOUNTER — Encounter: Payer: Self-pay | Admitting: Gastroenterology

## 2023-02-15 ENCOUNTER — Other Ambulatory Visit: Payer: Self-pay

## 2023-02-15 ENCOUNTER — Emergency Department (HOSPITAL_BASED_OUTPATIENT_CLINIC_OR_DEPARTMENT_OTHER)
Admission: EM | Admit: 2023-02-15 | Discharge: 2023-02-15 | Disposition: A | Discharge status: Home / Self Care | Payer: No Typology Code available for payment source | Attending: Emergency Medicine | Admitting: Emergency Medicine

## 2023-02-15 ENCOUNTER — Emergency Department (HOSPITAL_BASED_OUTPATIENT_CLINIC_OR_DEPARTMENT_OTHER): Payer: No Typology Code available for payment source

## 2023-02-15 ENCOUNTER — Encounter (HOSPITAL_BASED_OUTPATIENT_CLINIC_OR_DEPARTMENT_OTHER): Payer: Self-pay | Admitting: Emergency Medicine

## 2023-02-15 DIAGNOSIS — S0990XA Unspecified injury of head, initial encounter: Secondary | ICD-10-CM | POA: Insufficient documentation

## 2023-02-15 DIAGNOSIS — M25512 Pain in left shoulder: Secondary | ICD-10-CM | POA: Insufficient documentation

## 2023-02-15 DIAGNOSIS — M25572 Pain in left ankle and joints of left foot: Secondary | ICD-10-CM | POA: Insufficient documentation

## 2023-02-15 DIAGNOSIS — Z79899 Other long term (current) drug therapy: Secondary | ICD-10-CM | POA: Insufficient documentation

## 2023-02-15 DIAGNOSIS — M545 Low back pain, unspecified: Secondary | ICD-10-CM | POA: Insufficient documentation

## 2023-02-15 DIAGNOSIS — W108XXA Fall (on) (from) other stairs and steps, initial encounter: Secondary | ICD-10-CM | POA: Insufficient documentation

## 2023-02-15 DIAGNOSIS — Z7984 Long term (current) use of oral hypoglycemic drugs: Secondary | ICD-10-CM | POA: Insufficient documentation

## 2023-02-15 DIAGNOSIS — W19XXXA Unspecified fall, initial encounter: Secondary | ICD-10-CM

## 2023-02-15 DIAGNOSIS — M7918 Myalgia, other site: Secondary | ICD-10-CM

## 2023-02-15 MED ORDER — CYCLOBENZAPRINE HCL 10 MG PO TABS: 5.0000 mg | ORAL_TABLET | Freq: Two times a day (BID) | ORAL | 0 refills | Status: DC | PRN

## 2023-02-15 MED ORDER — CELECOXIB 200 MG PO CAPS: 200.0000 mg | ORAL_CAPSULE | Freq: Two times a day (BID) | ORAL | 0 refills | Status: DC

## 2023-02-15 MED ORDER — DIAZEPAM 5 MG PO TABS: 5.0000 mg | ORAL_TABLET | Freq: Once | ORAL | Status: DC

## 2023-02-15 MED ORDER — KETOROLAC TROMETHAMINE 60 MG/2ML IM SOLN
60.0000 mg | Freq: Once | INTRAMUSCULAR | Status: AC
  Administered 2023-02-15: 60 mg via INTRAMUSCULAR
  Filled 2023-02-15: qty 2

## 2023-02-15 MED ORDER — DIAZEPAM 2 MG PO TABS
2.0000 mg | ORAL_TABLET | Freq: Once | ORAL | Status: AC
  Administered 2023-02-15: 2 mg via ORAL
  Filled 2023-02-15: qty 1

## 2023-02-15 NOTE — ED Notes (Signed)
Patient NA to triage at 1930.

## 2023-02-15 NOTE — ED Provider Notes (Signed)
[place] EMERGENCY DEPARTMENT AT [facility] Provider Note   CSN: [id number] Arrival date & time: 02/15/23  1818     History  Chief Complaint  Patient presents with   Marletta Lor    Kristen Heath is a 57 y.o. female who presents emergency department with a chief complaint of fall.  Patient slipped and fell down about 5 stairs.  She fell back hit her head and neck, slipped down on her bottom.  She is complaining of pain in her shoulders, lower back and left ankle.  She has been ambulatory since this occurred about 3 PM today.  She did not lose consciousness.  She denies any weakness in the extremities.  She states that her ankle has become more sore and she is limping.  She denies nausea, vomiting, severe headache, changes in vision.  . Fall       Home Medications Prior to Admission medications   Medication Sig Start Date End Date Taking? Authorizing Provider  albuterol (ACCUNEB) 0.63 MG/3ML nebulizer solution Take 3 mLs (0.63 mg total) by nebulization every 6 (six) hours as needed for wheezing. 07/12/22   Luciano Cutter, MD  albuterol (PROVENTIL HFA;VENTOLIN HFA) 108 (90 BASE) MCG/ACT inhaler Inhale 2 puffs into the lungs every 6 (six) hours as needed for wheezing or shortness of breath.     [provider]  ARIPiprazole (ABILIFY) 10 MG tablet Take 10 mg by mouth. 09/10/22   [provider]  atorvastatin (LIPITOR) 20 MG tablet Take 20 mg by mouth daily at 6 PM.    [provider]  azelastine (ASTELIN) 0.1 % nasal spray Place 1 spray into both nostrils. 05/17/21   [provider]  betamethasone valerate ointment (VALISONE) 0.1 % Apply 1 Application topically daily. 04/02/22   [provider]  buPROPion (WELLBUTRIN XL) 150 MG 24 hr tablet Take 150 mg by mouth daily. 05/16/22   [provider]  Capsaicin 0.1 % CREA Apply topically.    [provider]  cholecalciferol (VITAMIN D) 1000 units tablet Take 2,000 Units  by mouth daily.    [provider]  diclofenac Sodium (VOLTAREN) 1 % GEL diclofenac 1 % topical gel    [provider]  ferrous sulfate 325 (65 FE) MG tablet Take 325 mg by mouth daily with breakfast.    [provider]  fluticasone (FLONASE) 50 MCG/ACT nasal spray Place 2 sprays into both nostrils daily. 03/28/14   Everlene Farrier, PA-C  gabapentin (NEURONTIN) 300 MG capsule Take 300 mg by mouth 3 (three) times daily. 11/08/22   [provider]  ibuprofen (ADVIL) 400 MG tablet Take 400 mg by mouth every 8 (eight) hours as needed. 11/09/22   [provider]  ipratropium (ATROVENT) 0.06 % nasal spray Place 1 spray into the nose. 07/13/22   [provider]  lidocaine (LIDODERM) 5 % Place 1 patch onto the skin daily. 09/10/22   [provider]  lisinopril-hydrochlorothiazide (ZESTORETIC) 10-12.5 MG tablet Take 1 tablet by mouth daily. 12/17/17   [provider]  loratadine (CLARITIN) 10 MG tablet Take 10 mg by mouth daily. 07/03/22   [provider]  Meclizine HCl 25 MG CHEW one tablet (25 mg dose). 07/13/22   [provider]  Melatonin 3 MG CAPS Take by mouth. 10/31/22   [provider]  metFORMIN (GLUCOPHAGE) 1000 MG tablet Take 1,000 mg by mouth daily with breakfast. 09/24/22   [provider]  methocarbamol (ROBAXIN) 500 MG tablet Take 1 tablet (  500 mg total) by mouth 2 (two) times daily as needed for muscle spasms. 12/10/22   [place], Acie Fredrickson, FNP  mometasone-formoterol (DULERA) 200-5 MCG/ACT AERO Inhale 2 puffs into the lungs. 11/13/22   [provider]  montelukast (SINGULAIR) 10 MG tablet Take 10 mg by mouth at bedtime.    [provider]  naproxen (NAPROSYN) 500 MG tablet Take 500 mg by mouth 2 (two) times daily with a meal. 09/10/22   [provider]  omeprazole (PRILOSEC) 20 MG capsule Take 20 mg by mouth daily.    [provider]  ondansetron (ZOFRAN-ODT) 4 MG  disintegrating tablet Take 1 tablet (4 mg total) by mouth every 8 (eight) hours as needed for nausea or vomiting. 09/20/22   Zenia Resides, MD  polyethylene glycol (MIRALAX / GLYCOLAX) packet Take 17 g by mouth daily.    [provider]  triamcinolone (KENALOG) 0.025 % ointment Apply 1 Application topically 2 (two) times daily. 10/22/22   [provider]  zolpidem (AMBIEN CR) 6.25 MG CR tablet Take 6.25 mg by mouth at bedtime as needed. 04/02/22   [provider]      Allergies    Modafinil, Niacin and related, and Niacin    Review of Systems   Review of Systems  Physical Exam Updated Vital Signs BP 102/64   Pulse 73   Temp 97.6 F (36.4 C)   Resp 20   Ht 5\' 4"  (1.626 m)   Wt 86.2 kg   LMP 01/22/2013   SpO2 97%   BMI 32.61 kg/m  Physical Exam Vitals and nursing note reviewed.  Constitutional:      General: She is not in acute distress.    Appearance: She is well-developed. She is not diaphoretic.  HENT:     Head: Normocephalic and atraumatic.     Right Ear: External ear normal.     Left Ear: External ear normal.     Nose: Nose normal.     Mouth/Throat:     Mouth: Mucous membranes are moist.  Eyes:     General: No scleral icterus.    Conjunctiva/sclera: Conjunctivae normal.  Neck:     Comments: No midline spinal tenderness Numbness in the upper trapezius Cardiovascular:     Rate and Rhythm: Normal rate and regular rhythm.     Heart sounds: Normal heart sounds. No murmur heard.    No friction rub. No gallop.  Pulmonary:     Effort: Pulmonary effort is normal. No respiratory distress.     Breath sounds: Normal breath sounds.  Abdominal:     General: Bowel sounds are normal. There is no distension.     Palpations: Abdomen is soft. There is no mass.     Tenderness: There is no abdominal tenderness. There is no guarding.  Musculoskeletal:     Cervical back: Normal range of motion.     Comments: Left ankle without obvious swelling or  deformity.  Neurovascular intact, reduced range of motion due to tenderness, normal strength .no midline spinal tenderness, bilateral lumbar paraspinal tenderness.  Skin:    General: Skin is warm and dry.  Neurological:     Mental Status: She is alert and oriented to person, place, and time.  Psychiatric:        Behavior: Behavior normal.     ED Results / Procedures / Treatments   Labs (all labs ordered are listed, but only abnormal results are displayed) Labs Reviewed - No data to display  EKG None  Radiology CT Head Wo Contrast  Result Date: 02/15/2023 CLINICAL DATA:  Polytrauma, blunt.  Fall down stairs. EXAM: CT HEAD WITHOUT CONTRAST CT CERVICAL SPINE WITHOUT CONTRAST TECHNIQUE: Multidetector CT imaging of the head and cervical spine was performed following the standard protocol without intravenous contrast. Multiplanar CT image reconstructions of the cervical spine were also generated. RADIATION DOSE REDUCTION: This exam was performed according to the departmental dose-optimization program which includes automated exposure control, adjustment of the mA and/or kV according to patient size and/or use of iterative reconstruction technique. COMPARISON:  07/11/2021. FINDINGS: CT HEAD FINDINGS Brain: No acute intracranial hemorrhage, midline shift or mass effect. No extra-axial fluid collection. Gray-white matter differentiation is within normal limits. No hydrocephalus. Vascular: No hyperdense vessel or unexpected calcification. Skull: Normal. Negative for fracture or focal lesion. Sinuses/Orbits: Mild mucosal thickening is present in the left maxillary sinus. No acute orbital abnormality. Other: None. CT CERVICAL SPINE FINDINGS Alignment: Normal. Skull base and vertebrae: No acute fracture. No primary bone lesion or focal pathologic process. Soft tissues and spinal canal: No prevertebral fluid or swelling. No visible canal hematoma. Disc levels: Intervertebral disc space narrowing, endplate  osteophyte formation, and mild facet arthropathy is noted. Upper chest: No acute abnormality. Other: None. IMPRESSION: 1. No acute intracranial process. 2. Mild degenerative changes in the cervical spine without evidence of acute fracture. Electronically Signed   By: Thornell Sartorius M.D.   On: 02/15/2023 22:46   CT Cervical Spine Wo Contrast  Result Date: 02/15/2023 CLINICAL DATA:  Polytrauma, blunt.  Fall down stairs. EXAM: CT HEAD WITHOUT CONTRAST CT CERVICAL SPINE WITHOUT CONTRAST TECHNIQUE: Multidetector CT imaging of the head and cervical spine was performed following the standard protocol without intravenous contrast. Multiplanar CT image reconstructions of the cervical spine were also generated. RADIATION DOSE REDUCTION: This exam was performed according to the departmental dose-optimization program which includes automated exposure control, adjustment of the mA and/or kV according to patient size and/or use of iterative reconstruction technique. COMPARISON:  07/11/2021. FINDINGS: CT HEAD FINDINGS Brain: No acute intracranial hemorrhage, midline shift or mass effect. No extra-axial fluid collection. Gray-white matter differentiation is within normal limits. No hydrocephalus. Vascular: No hyperdense vessel or unexpected calcification. Skull: Normal. Negative for fracture or focal lesion. Sinuses/Orbits: Mild mucosal thickening is present in the left maxillary sinus. No acute orbital abnormality. Other: None. CT CERVICAL SPINE FINDINGS Alignment: Normal. Skull base and vertebrae: No acute fracture. No primary bone lesion or focal pathologic process. Soft tissues and spinal canal: No prevertebral fluid or swelling. No visible canal hematoma. Disc levels: Intervertebral disc space narrowing, endplate osteophyte formation, and mild facet arthropathy is noted. Upper chest: No acute abnormality. Other: None. IMPRESSION: 1. No acute intracranial process. 2. Mild degenerative changes in the cervical spine without  evidence of acute fracture. Electronically Signed   By: Thornell Sartorius M.D.   On: 02/15/2023 22:46    Procedures Procedures    Medications Ordered in ED Medications  ketorolac (TORADOL) injection 60 mg (has no administration in time range)  diazepam (VALIUM) tablet 2 mg (has no administration in time range)    ED Course/ Medical Decision Making/ A&P Clinical Course as of 02/15/23 2305  [facility] Feb 15, 2023  2304 CT Head Wo Contrast [AH]  2304 CT Cervical Spine Wo Contrast I have visualized and interpreted CT head and C-spine both of which showed no acute findings. [AH]    Clinical Course User Index [AH] Arthor Captain, PA-C  Medical Decision Making Patient here after fall.  Symptoms seem to be consistent with musculoskeletal pain.  She is ambulatory without obvious signs of fracture.  No further imaging needed at this time.  Will discharge with anti-inflammatories and muscle relaxers.  Appropriate for discharge.  Amount and/or Complexity of Data Reviewed Radiology: ordered. Decision-making details documented in ED Course.  Risk Prescription drug management.           Final Clinical Impression(s) / ED Diagnoses Final diagnoses:  None    Rx / DC Orders ED Discharge Orders     None         Arthor Captain, PA-C 02/15/23 2320    Virgina Norfolk, DO 02/16/23 1842

## 2023-02-15 NOTE — ED Notes (Signed)
Patient NA to triage at 1938.

## 2023-02-15 NOTE — ED Triage Notes (Signed)
Patient brought to triage via wheelchair reporting her knees gave out today causing her to fall down the stairs, landing on her back. Patient reports she usually uses a cane or walker to ambulate and was using her cane when she fell. Patient reports hitting her head; denies blood thinners or LOC.

## 2023-02-15 NOTE — Discharge Instructions (Addendum)
Contact a health care provider if:  Your pain gets worse.  Medicines do not help ease your pain.  You cannot use the part of your body that hurts, such as your arm, leg, or neck.  You have trouble sleeping.  You have trouble doing your normal activities.  Get help right away if:  You have a new injury and your pain is worse or different.  You feel numb or you have tingling in the painful area.

## 2023-03-11 NOTE — Progress Notes (Signed)
Chief Complaint: Constipation, Refractory heartburn  HPI: Patient is a 57 year old female patient with past medical history of asthma, hypertension, diabetes type 2, HLD, allergic rhinitis, chronic headaches, and OSA/insomnia with cpap who was referred to me by [facility] on 12/06/2022 for a complaint of constipation and refractory heartburn.  Interval History:   Presents with several gastrointestinal complaints.  Her first complaint is of uncontrolled GERD she has had several years.she reports she has trialed 1 or more PPI in the past and failed include Nexium 40 mg po daily and Protonix 40 mg po daily. Patient currently taking Omeprazole 20mg  po daily before breakfast.  Patient admits to heart burn, regurgitation, dyspepsia daily.  She denies dysphagia.  Patient also admits she will have nausea with vomiting 4-5 times a week with or without food.  No known triggers.  Patient reports she takes Ondansetron prn few times a week which works well.  Patient has never had endoscopy.  Patient does report taking Celebrex and Naproxen prn.  She has started 1 new medication which is Abilify for mental disorder.   Her second complaint is of chronic idiopathic constipation.She has tried stool softeners, Miralax po daily, and docusate. She reports she has large hard stool every 2-3 days with straining.  She reports when she passes the large hard stool she will have rectal pain.  Also complains of lot of bloating and gas. Intermittently has had dark stools for several years.  She reports it resolves on its own.  Patient was taking ferrous sulfate 1 tablet po daily prescribed by her GYN but states she ran out of RX and has been off since June. GYN was monitoring her levels.  History of hysterectomy , reports no vaginal bleeding.  Patient is pescatarian and only eats fish.  Patient reports intermittent lower abdominal cramping that does alleviate after bowel movement.  He reports her last colonoscopy was in  11/2013 with a few benign polyps.  Patient not on any blood thinners.  No family history of cancer.  Non-smoker.  Patient reports she drinks 3 glasses of wine daily. Surgical history includes hysterectomy (full), fibroids removed, c section.  Wt Readings from Last 3 Encounters:  03/12/23 201 lb (91.2 kg)  02/15/23 190 lb (86.2 kg)  12/10/22 192 lb (87.1 kg)   Past Medical History:  Diagnosis Date   Anemia    Anxiety    no current meds   Arthritis    knees   Asthma    cough induced asthma   CPAP (continuous positive airway pressure) dependence    Dry skin    Gestational diabetes    Headache(784.0)    Migraines   History of kidney stones    no surgery - passed stone   Hyperlipidemia    Hypertension    Pre-diabetes    PTSD (post-traumatic stress disorder)    Seasonal allergies    Sleep apnea    recent diagnosis 01/18/13    Past Surgical History:  Procedure Laterality Date   ABDOMINAL HYSTERECTOMY N/A 02/09/2013   Procedure: HYSTERECTOMY ABDOMINAL;  Surgeon: Juluis Mire, MD;  Location: [facility];  Service: Gynecology;  Laterality: N/A;   arthroscopic right knee      CESAREAN SECTION  1993   COLONOSCOPY     CYSTOSCOPY N/A 05/08/2016   Procedure: CYSTOSCOPY;  Surgeon: Richardean Chimera, MD;  Location: [facility];  Service: Gynecology;  Laterality: N/A;   LAPAROSCOPIC SALPINGO OOPHERECTOMY Bilateral 05/08/2016   Procedure: ATTEMPTED LAPAROSCOPIC SALPINGO OOPHORECTOMY;  Surgeon: Richardean Chimera, MD;  Location: [facility];  Service: Gynecology;  Laterality: Bilateral;  Neal Dy RNFA confirmed 04/16/16   MYOMECTOMY ABDOMINAL APPROACH     SALPINGOOPHORECTOMY  05/08/2016   Procedure: BILATERAL SALPINGO OOPHORECTOMY,OPEN;  Surgeon: Richardean Chimera, MD;  Location: [facility];  Service: Gynecology;;   UNILATERAL SALPINGECTOMY Right 02/09/2013   Procedure: UNILATERAL SALPINGECTOMY;  Surgeon: Juluis Mire, MD;  Location: [facility];  Service: Gynecology;  Laterality: Right;   WISDOM TOOTH EXTRACTION     Current  Outpatient Medications  Medication Sig Dispense Refill   albuterol (ACCUNEB) 0.63 MG/3ML nebulizer solution Take 3 mLs (0.63 mg total) by nebulization every 6 (six) hours as needed for wheezing. 75 mL 12   albuterol (PROVENTIL HFA;VENTOLIN HFA) 108 (90 BASE) MCG/ACT inhaler Inhale 2 puffs into the lungs every 6 (six) hours as needed for wheezing or shortness of breath.      ARIPiprazole (ABILIFY) 10 MG tablet Take 10 mg by mouth.     atorvastatin (LIPITOR) 20 MG tablet Take 20 mg by mouth daily at 6 PM.     azelastine (ASTELIN) 0.1 % nasal spray Place 1 spray into both nostrils.     betamethasone valerate ointment (VALISONE) 0.1 % Apply 1 Application topically daily.     buPROPion (WELLBUTRIN XL) 150 MG 24 hr tablet Take 150 mg by mouth daily.     Capsaicin 0.1 % CREA Apply topically.     celecoxib (CELEBREX) 200 MG capsule Take 1 capsule (200 mg total) by mouth 2 (two) times daily. 20 capsule 0   cholecalciferol (VITAMIN D) 1000 units tablet Take 2,000 Units by mouth daily.     cyclobenzaprine (FLEXERIL) 10 MG tablet Take 0.5-1 tablets (5-10 mg total) by mouth 2 (two) times daily as needed for muscle spasms. 20 tablet 0   diclofenac Sodium (VOLTAREN) 1 % GEL diclofenac 1 % topical gel     ferrous sulfate 325 (65 FE) MG tablet Take 325 mg by mouth daily with breakfast.     fluticasone (FLONASE) 50 MCG/ACT nasal spray Place 2 sprays into both nostrils daily. 16 g 2   gabapentin (NEURONTIN) 300 MG capsule Take 300 mg by mouth 3 (three) times daily.     ibuprofen (ADVIL) 400 MG tablet Take 400 mg by mouth every 8 (eight) hours as needed.     ipratropium (ATROVENT) 0.06 % nasal spray Place 1 spray into the nose.     lidocaine (LIDODERM) 5 % Place 1 patch onto the skin daily.     lisinopril-hydrochlorothiazide (ZESTORETIC) 10-12.5 MG tablet Take 1 tablet by mouth daily.     loratadine (CLARITIN) 10 MG tablet Take 10 mg by mouth daily.     Meclizine HCl 25 MG CHEW one tablet (25 mg dose).      Melatonin 3 MG CAPS Take by mouth.     metFORMIN (GLUCOPHAGE) 1000 MG tablet Take 1,000 mg by mouth daily with breakfast.     methocarbamol (ROBAXIN) 500 MG tablet Take 1 tablet (500 mg total) by mouth 2 (two) times daily as needed for muscle spasms. 20 tablet 0   mometasone-formoterol (DULERA) 200-5 MCG/ACT AERO Inhale 2 puffs into the lungs.     montelukast (SINGULAIR) 10 MG tablet Take 10 mg by mouth at bedtime.     naproxen (NAPROSYN) 500 MG tablet Take 500 mg by mouth 2 (two) times daily with a meal.     omeprazole (PRILOSEC) 20 MG capsule Take 20 mg by mouth daily.  ondansetron (ZOFRAN-ODT) 4 MG disintegrating tablet Take 1 tablet (4 mg total) by mouth every 8 (eight) hours as needed for nausea or vomiting. 10 tablet 0   polyethylene glycol (MIRALAX / GLYCOLAX) packet Take 17 g by mouth daily.     triamcinolone (KENALOG) 0.025 % ointment Apply 1 Application topically 2 (two) times daily.     zolpidem (AMBIEN CR) 6.25 MG CR tablet Take 6.25 mg by mouth at bedtime as needed.     No current facility-administered medications for this visit.   Allergies as of 03/12/2023 - Review Complete 03/12/2023  Allergen Reaction Noted   Modafinil Rash 02/22/2021   Niacin and related Other (See Comments) 03/17/2019   Niacin Other (See Comments) and Rash 06/26/2012   No family history on file.  Review of Systems:    Constitutional: No weight loss, fever, chills, weakness or fatigue HEENT: Eyes: No change in vision               Ears, Nose, Throat:  No change in hearing or congestion Skin: No rash or itching Cardiovascular: No chest pain, chest pressure or palpitations   Respiratory: No SOB or cough Gastrointestinal: See HPI and otherwise negative Genitourinary: No dysuria or change in urinary frequency Neurological: No headache, dizziness or syncope Musculoskeletal: No new muscle or joint pain Hematologic: No bleeding or bruising Psychiatric: No history of depression or anxiety   Physical  Exam:  Vital signs: BP 114/72   Pulse 79   Ht 5\' 4"  (1.626 m)   Wt 201 lb (91.2 kg)   LMP 01/22/2013   BMI 34.50 kg/m   Constitutional:   Pleasant African-American female appears to be in NAD, Well developed, Well nourished, alert and cooperative Neck:  Supple Throat: Oral cavity and pharynx without inflammation, swelling or lesion.  Respiratory: Respirations even and unlabored. Lungs clear to auscultation bilaterally.   No wheezes, crackles, or rhonchi.  Cardiovascular: Normal S1, S2. Regular rate and rhythm. No peripheral edema, cyanosis or pallor.  Gastrointestinal:  Soft, nondistended, lower abdominal tenderness with palpation. No rebound or guarding.  Hypoactive bowel sounds. No appreciable masses or hepatomegaly. Rectal:  Not performed.  Msk:  Symmetrical without gross deformities.  Neurologic:  Alert and  oriented x4;  grossly normal neurologically.  Skin:   Dry and intact without significant lesions or rashes. Psychiatric: Oriented to person, place and time. Demonstrates good judgement and reason without abnormal affect or behaviors.  RELEVANT LABS AND IMAGING: CBC     Latest Ref Rng & Units 09/20/2022    4:44 PM 05/09/2016    5:31 AM 04/30/2016    9:27 AM  CBC  WBC 3.4 - 10.8 x10E3/uL 5.6  9.5  4.9   Hemoglobin 11.1 - 15.9 g/dL 16.1  09.6  04.5   Hematocrit 34.0 - 46.6 % 38.2  30.5  36.4   Platelets 150 - 450 x10E3/uL 260  226  266    CMP     Component Value Date/Time   NA 142 09/20/2022 1644   K 4.1 09/20/2022 1644   CL 102 09/20/2022 1644   CO2 27 09/20/2022 1644   GLUCOSE 105 (H) 09/20/2022 1644   GLUCOSE 172 (H) 04/30/2016 0927   BUN 17 09/20/2022 1644   CREATININE 0.85 09/20/2022 1644   CALCIUM 10.5 (H) 09/20/2022 1644   GFRNONAA >60 04/30/2016 0927   GFRAA >60 04/30/2016 0927   No imaging or procedures on file.  Assessment: 57 year old African-American female patient that presents with several gastrointestinal complaints.  She  presents with  uncontrolled GERD, nausea and vomiting, and intermittent dark tarry stools.  We will go ahead and proceed with upper GI endoscopy to evaluate and rule out peptic ulcer disease, H. Pylori, and/or Barrett's.  Will also check a CBC given her history of anemia and reports of dark tarry stools . Patient also has lower GI symptoms with chronic constipation, abdominal cramping, and gas and bloat.  Will go ahead and proceed with colonoscopy with 2-day prep evaluate.  If negative exam will consider SIBO testing.  Encounter Diagnoses  Name Primary?   Chronic idiopathic constipation Yes   Gastroesophageal reflux disease, unspecified whether esophagitis present    Nausea and vomiting, unspecified vomiting type    Bloating    Flatulence    Rectal discomfort    Black tarry stools    History of colonic polyps    Abdominal discomfort    Anemia, unspecified type     Plan: -Continue omeprazole 20mg  po daily  -GERD diet -Increase Miralax from once daily to twice daily , consider pro secretory agent if it does not work. - check CBC today -EGD/colon with 2 day prep in [facility]. --The risks and benefits of EGD with possible biopsies and esophageal dilation were discussed with the patient who agrees to proceed.The risks and benefits of colonoscopy with possible polypectomy / biopsies were discussed and the patient agrees to proceed.   The benefits and risks of the planned procedure were described in detail with the patient or (when appropriate) their health care proxy.  Risks were outlined as including, but not limited to, bleeding, infection, perforation, adverse medication reaction leading to cardiac or pulmonary decompensation, pancreatitis (if ERCP).  The limitation of incomplete mucosal visualization was also discussed.  No guarantees or warranties were given.   Kristen May, FNP-C [facility] 03/12/2023, 9:58 AM   The APP and I saw this patient together, with the APP acting as my scribe.  I have  reviewed the above documentation for accuracy and completeness and I agree with the above documentation. Multiple chronic digestive symptoms requiring lab and endoscopic workup.  Will start with MiraLAX to help relieve constipation.  If insufficient results are limited by side effects, we Heath be able to proceed to Linzess or Amitiza, Heath be somewhat dependent on [place] pharmacy coverage.  If endoscopic workup unrevealing, consider gastric emptying study.  Kristen Fruge L. Myrtie Neither, MD    Cc: Leslie Dales, MD

## 2023-03-12 ENCOUNTER — Encounter: Payer: Self-pay | Admitting: Gastroenterology

## 2023-03-12 ENCOUNTER — Other Ambulatory Visit (INDEPENDENT_AMBULATORY_CARE_PROVIDER_SITE_OTHER): Payer: Federal, State, Local not specified - PPO

## 2023-03-12 ENCOUNTER — Ambulatory Visit: Payer: No Typology Code available for payment source | Admitting: Gastroenterology

## 2023-03-12 VITALS — BP 114/72 | HR 79 | Ht 64.0 in | Wt 201.0 lb

## 2023-03-12 DIAGNOSIS — R112 Nausea with vomiting, unspecified: Secondary | ICD-10-CM

## 2023-03-12 DIAGNOSIS — K219 Gastro-esophageal reflux disease without esophagitis: Secondary | ICD-10-CM | POA: Diagnosis not present

## 2023-03-12 DIAGNOSIS — K921 Melena: Secondary | ICD-10-CM

## 2023-03-12 DIAGNOSIS — K5904 Chronic idiopathic constipation: Secondary | ICD-10-CM

## 2023-03-12 DIAGNOSIS — D649 Anemia, unspecified: Secondary | ICD-10-CM

## 2023-03-12 DIAGNOSIS — R140 Abdominal distension (gaseous): Secondary | ICD-10-CM

## 2023-03-12 DIAGNOSIS — R109 Unspecified abdominal pain: Secondary | ICD-10-CM

## 2023-03-12 DIAGNOSIS — K6289 Other specified diseases of anus and rectum: Secondary | ICD-10-CM

## 2023-03-12 DIAGNOSIS — R143 Flatulence: Secondary | ICD-10-CM

## 2023-03-12 DIAGNOSIS — Z860100 Personal history of colon polyps, unspecified: Secondary | ICD-10-CM

## 2023-03-12 LAB — CBC WITH DIFFERENTIAL/PLATELET
Basophils Absolute: 0 10*3/uL (ref 0.0–0.1)
Basophils Relative: 0.4 % (ref 0.0–3.0)
Eosinophils Absolute: 0.1 10*3/uL (ref 0.0–0.7)
Eosinophils Relative: 1.9 % (ref 0.0–5.0)
HCT: 40.5 % (ref 36.0–46.0)
Hemoglobin: 13.1 g/dL (ref 12.0–15.0)
Lymphocytes Relative: 41.6 % (ref 12.0–46.0)
Lymphs Abs: 1.8 10*3/uL (ref 0.7–4.0)
MCHC: 32.4 g/dL (ref 30.0–36.0)
MCV: 87 fL (ref 78.0–100.0)
Monocytes Absolute: 0.4 10*3/uL (ref 0.1–1.0)
Monocytes Relative: 8.8 % (ref 3.0–12.0)
Neutro Abs: 2.1 10*3/uL (ref 1.4–7.7)
Neutrophils Relative %: 47.3 % (ref 43.0–77.0)
Platelets: 250 10*3/uL (ref 150.0–400.0)
RBC: 4.65 Mil/uL (ref 3.87–5.11)
RDW: 14.9 % (ref 11.5–15.5)
WBC: 4.4 10*3/uL (ref 4.0–10.5)

## 2023-03-12 MED ORDER — NA SULFATE-K SULFATE-MG SULF 17.5-3.13-1.6 GM/177ML PO SOLN: 1.0000 | Freq: Once | ORAL | 0 refills | Status: AC

## 2023-03-12 NOTE — Patient Instructions (Addendum)
Please increase Miralax to twice daily   Your provider has requested that you go to the basement level for lab work before leaving today. Press "B" on the elevator. The lab is located at the first door on the left as you exit the elevator.   You have been scheduled for an endoscopy/colonoscopy. Please follow written instructions given to you at your visit today.  If you use inhalers (even only as needed), please bring them with you on the day of your procedure.  If you take any of the following medications, they will need to be adjusted prior to your procedure:   DO NOT TAKE 7 DAYS PRIOR TO TEST- Trulicity (dulaglutide) Ozempic, Wegovy (semaglutide) Mounjaro (tirzepatide) Bydureon Bcise (exanatide extended release)  DO NOT TAKE 1 DAY PRIOR TO YOUR TEST Rybelsus (semaglutide) Adlyxin (lixisenatide) Victoza (liraglutide) Byetta (exanatide) ___________________________________________________________________________   _______________________________________________________  If your blood pressure at your visit was 140/90 or greater, please contact your primary care physician to follow up on this.  _______________________________________________________  If you are age 63 or older, your body mass index should be between 23-30. Your Body mass index is 34.5 kg/m. If this is out of the aforementioned range listed, please consider follow up with your Primary Care Provider.  If you are age 5 or younger, your body mass index should be between 19-25. Your Body mass index is 34.5 kg/m. If this is out of the aformentioned range listed, please consider follow up with your Primary Care Provider.   ________________________________________________________  The [facility] providers would like to encourage you to use [facility] to communicate with providers for non-urgent requests or questions.  Due to long hold times on the telephone, sending your provider a message by [facility] may be a faster and  more efficient way to get a response.  Please allow 48 business hours for a response.  Please remember that this is for non-urgent requests.  _______________________________________________________ It was a pleasure to see you today!  Thank you for trusting me with your gastrointestinal care!

## 2023-03-28 ENCOUNTER — Emergency Department (HOSPITAL_BASED_OUTPATIENT_CLINIC_OR_DEPARTMENT_OTHER)
Admission: EM | Admit: 2023-03-28 | Discharge: 2023-03-28 | Disposition: A | Discharge status: Home / Self Care | Payer: No Typology Code available for payment source | Attending: Emergency Medicine | Admitting: Emergency Medicine

## 2023-03-28 ENCOUNTER — Encounter (HOSPITAL_BASED_OUTPATIENT_CLINIC_OR_DEPARTMENT_OTHER): Payer: Self-pay | Admitting: Emergency Medicine

## 2023-03-28 ENCOUNTER — Other Ambulatory Visit: Payer: Self-pay

## 2023-03-28 ENCOUNTER — Emergency Department (HOSPITAL_BASED_OUTPATIENT_CLINIC_OR_DEPARTMENT_OTHER): Payer: No Typology Code available for payment source | Admitting: Radiology

## 2023-03-28 DIAGNOSIS — I10 Essential (primary) hypertension: Secondary | ICD-10-CM | POA: Diagnosis not present

## 2023-03-28 DIAGNOSIS — Z20822 Contact with and (suspected) exposure to covid-19: Secondary | ICD-10-CM | POA: Insufficient documentation

## 2023-03-28 DIAGNOSIS — Z7952 Long term (current) use of systemic steroids: Secondary | ICD-10-CM | POA: Insufficient documentation

## 2023-03-28 DIAGNOSIS — R059 Cough, unspecified: Secondary | ICD-10-CM | POA: Diagnosis present

## 2023-03-28 DIAGNOSIS — E119 Type 2 diabetes mellitus without complications: Secondary | ICD-10-CM | POA: Insufficient documentation

## 2023-03-28 DIAGNOSIS — J168 Pneumonia due to other specified infectious organisms: Secondary | ICD-10-CM | POA: Insufficient documentation

## 2023-03-28 DIAGNOSIS — J189 Pneumonia, unspecified organism: Secondary | ICD-10-CM

## 2023-03-28 DIAGNOSIS — J45909 Unspecified asthma, uncomplicated: Secondary | ICD-10-CM | POA: Insufficient documentation

## 2023-03-28 DIAGNOSIS — Z79899 Other long term (current) drug therapy: Secondary | ICD-10-CM | POA: Diagnosis not present

## 2023-03-28 DIAGNOSIS — Z7984 Long term (current) use of oral hypoglycemic drugs: Secondary | ICD-10-CM | POA: Diagnosis not present

## 2023-03-28 DIAGNOSIS — R519 Headache, unspecified: Secondary | ICD-10-CM | POA: Insufficient documentation

## 2023-03-28 LAB — RESP PANEL BY RT-PCR (RSV, FLU A&B, COVID)  RVPGX2
Influenza A by PCR: NEGATIVE
Influenza B by PCR: NEGATIVE
Resp Syncytial Virus by PCR: NEGATIVE
SARS Coronavirus 2 by RT PCR: NEGATIVE

## 2023-03-28 MED ORDER — KETOROLAC TROMETHAMINE 30 MG/ML IJ SOLN
30.0000 mg | Freq: Once | INTRAMUSCULAR | Status: AC
  Administered 2023-03-28: 30 mg via INTRAMUSCULAR
  Filled 2023-03-28: qty 1

## 2023-03-28 MED ORDER — ALBUTEROL SULFATE (2.5 MG/3ML) 0.083% IN NEBU: 2.5000 mg | INHALATION_SOLUTION | Freq: Once | RESPIRATORY_TRACT | Status: DC

## 2023-03-28 MED ORDER — AMOXICILLIN-POT CLAVULANATE 875-125 MG PO TABS: 1.0000 | ORAL_TABLET | Freq: Two times a day (BID) | ORAL | 0 refills | Status: AC

## 2023-03-28 MED ORDER — IPRATROPIUM BROMIDE 0.02 % IN SOLN: 0.5000 mg | Freq: Once | RESPIRATORY_TRACT | Status: DC

## 2023-03-28 MED ORDER — PREDNISONE 20 MG PO TABS: 40.0000 mg | ORAL_TABLET | Freq: Every day | ORAL | 0 refills | Status: AC

## 2023-03-28 MED ORDER — AZITHROMYCIN 250 MG PO TABS: 250.0000 mg | ORAL_TABLET | Freq: Every day | ORAL | 0 refills | Status: AC

## 2023-03-28 MED ORDER — IPRATROPIUM-ALBUTEROL 0.5-2.5 (3) MG/3ML IN SOLN
3.0000 mL | Freq: Once | RESPIRATORY_TRACT | Status: DC
Start: 2023-03-28 — End: 2023-03-28

## 2023-03-28 MED ORDER — ALBUTEROL SULFATE (2.5 MG/3ML) 0.083% IN NEBU: 5.0000 mg | INHALATION_SOLUTION | Freq: Once | RESPIRATORY_TRACT | Status: DC

## 2023-03-28 MED ORDER — PREDNISONE 20 MG PO TABS: 40.0000 mg | ORAL_TABLET | Freq: Once | ORAL | Status: DC

## 2023-03-28 MED ORDER — BENZONATATE 100 MG PO CAPS: 100.0000 mg | ORAL_CAPSULE | Freq: Three times a day (TID) | ORAL | 0 refills | Status: AC | PRN

## 2023-03-28 NOTE — ED Provider Notes (Signed)
 [place] EMERGENCY DEPARTMENT AT [facility] Provider Note   CSN: [id number] Arrival date & time: 03/28/23  1403     History  Chief Complaint  Patient presents with   Cough    Kristen Heath is a 58 y.o. female.   Cough   58 year old female presents emergency department with complaints of cough, nasal congestion and, sinus pressure for the past week or so.  Patient with history of asthma and also feels like she is wheezing somewhat more frequently over the past week or so.  Has tried DayQuil/NyQuil without significant improvement of symptoms prior to visit to the emergency department.  Denies any fever, chills, abdominal pain, nausea, vomiting, urinary symptoms, change in bowel habits.  Past medical history significant for hyperlipidemia, OSA, diabetes mellitus type 2, asthma, hypertension  Home Medications Prior to Admission medications   Medication Sig Start Date End Date Taking? Authorizing Provider  amoxicillin -clavulanate (AUGMENTIN ) 875-125 MG tablet Take 1 tablet by mouth every 12 (twelve) hours. 03/28/23  Yes Silver Fell A, PA  azithromycin  (ZITHROMAX ) 250 MG tablet Take 1 tablet (250 mg total) by mouth daily. Take first 2 tablets together, then 1 every day until finished. 03/28/23  Yes Silver Fell A, PA  benzonatate  (TESSALON ) 100 MG capsule Take 1 capsule (100 mg total) by mouth 3 (three) times daily as needed. 03/28/23  Yes Silver Fell A, PA  predniSONE  (DELTASONE ) 20 MG tablet Take 2 tablets (40 mg total) by mouth daily with breakfast for 5 days. 03/28/23 04/02/23 Yes Kymber Kosar A, PA  SYNJARDY XR 12.07-998 MG TB24 Take 1 tablet by mouth daily. 01/28/23  Yes [provider]  albuterol  (ACCUNEB ) 0.63 MG/3ML nebulizer solution Take 3 mLs (0.63 mg total) by nebulization every 6 (six) hours as needed for wheezing. 07/12/22   Kassie Acquanetta Bradley, MD  albuterol  (PROVENTIL  HFA;VENTOLIN  HFA) 108 (90 BASE) MCG/ACT inhaler Inhale 2 puffs into the lungs  every 6 (six) hours as needed for wheezing or shortness of breath.     [provider]  ARIPiprazole (ABILIFY) 10 MG tablet Take 10 mg by mouth. 09/10/22   [provider]  atorvastatin (LIPITOR) 20 MG tablet Take 20 mg by mouth daily at 6 PM.    [provider]  azelastine (ASTELIN) 0.1 % nasal spray Place 1 spray into both nostrils. 05/17/21   [provider]  betamethasone valerate ointment (VALISONE) 0.1 % Apply 1 Application topically daily. 04/02/22   [provider]  buPROPion (WELLBUTRIN XL) 150 MG 24 hr tablet Take 150 mg by mouth daily. 05/16/22   [provider]  Capsaicin 0.1 % CREA Apply topically.    [provider]  celecoxib  (CELEBREX ) 200 MG capsule Take 1 capsule (200 mg total) by mouth 2 (two) times daily. 02/15/23   Harris, Abigail, PA-C  cholecalciferol (VITAMIN D) 1000 units tablet Take 2,000 Units by mouth daily.    [provider]  cyclobenzaprine  (FLEXERIL ) 10 MG tablet Take 0.5-1 tablets (5-10 mg total) by mouth 2 (two) times daily as needed for muscle spasms. 02/15/23   Harris, Abigail, PA-C  diclofenac Sodium (VOLTAREN) 1 % GEL diclofenac 1 % topical gel    [provider]  ferrous sulfate 325 (65 FE) MG tablet Take 325 mg by mouth daily with breakfast.    [provider]  fluticasone  (FLONASE ) 50 MCG/ACT nasal spray Place 2 sprays into both nostrils daily. 03/28/14   Dansie, William, PA-C  gabapentin (NEURONTIN) 300 MG capsule Take 300 mg by  mouth 3 (three) times daily. 11/08/22   [provider]  ibuprofen  (ADVIL ) 400 MG tablet Take 400 mg by mouth every 8 (eight) hours as needed. 11/09/22   [provider]  ipratropium (ATROVENT ) 0.06 % nasal spray Place 1 spray into the nose. 07/13/22   [provider]  lidocaine  (LIDODERM ) 5 % Place 1 patch onto the skin daily. 09/10/22   [provider]  lisinopril-hydrochlorothiazide (ZESTORETIC) 10-12.5 MG tablet Take  1 tablet by mouth daily. 12/17/17   [provider]  loratadine (CLARITIN) 10 MG tablet Take 10 mg by mouth daily. 07/03/22   [provider]  Meclizine HCl 25 MG CHEW one tablet (25 mg dose). 07/13/22   [provider]  Melatonin 3 MG CAPS Take by mouth. 10/31/22   [provider]  metFORMIN (GLUCOPHAGE) 1000 MG tablet Take 1,000 mg by mouth daily with breakfast. 09/24/22   [provider]  methocarbamol  (ROBAXIN ) 500 MG tablet Take 1 tablet (500 mg total) by mouth 2 (two) times daily as needed for muscle spasms. 12/10/22   Mound, Darryle BRAVO, FNP  mometasone-formoterol (DULERA) 200-5 MCG/ACT AERO Inhale 2 puffs into the lungs. 11/13/22   [provider]  montelukast (SINGULAIR) 10 MG tablet Take 10 mg by mouth at bedtime.    [provider]  naproxen (NAPROSYN) 500 MG tablet Take 500 mg by mouth 2 (two) times daily with a meal. 09/10/22   [provider]  omeprazole (PRILOSEC) 20 MG capsule Take 20 mg by mouth daily.    [provider]  ondansetron  (ZOFRAN -ODT) 4 MG disintegrating tablet Take 1 tablet (4 mg total) by mouth every 8 (eight) hours as needed for nausea or vomiting. 09/20/22   Banister, Pamela K, MD  polyethylene glycol (MIRALAX / GLYCOLAX) packet Take 17 g by mouth daily.    [provider]  triamcinolone  (KENALOG ) 0.025 % ointment Apply 1 Application topically 2 (two) times daily. 10/22/22   [provider]  zolpidem  (AMBIEN  CR) 6.25 MG CR tablet Take 6.25 mg by mouth at bedtime as needed. 04/02/22   [provider]      Allergies    Modafinil, Niacin and related, and Niacin    Review of Systems   Review of Systems  Respiratory:  Positive for cough.   All other systems reviewed and are negative.   Physical Exam Updated Vital Signs BP 130/76   Pulse 86   Temp 97.7 F (36.5 C)   Resp 17   Wt 86.2 kg   LMP 01/22/2013   SpO2 99%   BMI 32.61 kg/m  Physical Exam Vitals and nursing  note reviewed.  Constitutional:      General: She is not in acute distress.    Appearance: She is well-developed.  HENT:     Head: Normocephalic and atraumatic.     Comments: Maxillary tenderness tenderness to palpation.    Nose: Congestion and rhinorrhea present.  Eyes:     Conjunctiva/sclera: Conjunctivae normal.  Cardiovascular:     Rate and Rhythm: Normal rate and regular rhythm.     Heart sounds: No murmur heard. Pulmonary:     Effort: Pulmonary effort is normal. No respiratory distress.     Comments: Faint expiratory wheeze appreciated bilaterally.  Decreased lung sounds left lower lung field. Abdominal:     Palpations: Abdomen is soft.     Tenderness: There is no abdominal tenderness.  Musculoskeletal:        General: No swelling.  Cervical back: Neck supple.     Right lower leg: No edema.     Left lower leg: No edema.  Skin:    General: Skin is warm and dry.     Capillary Refill: Capillary refill takes less than 2 seconds.  Neurological:     Mental Status: She is alert.     Comments: Alert and oriented to self, place, time and event.   Speech is fluent, clear without dysarthria or dysphasia.   Strength 5/5 in upper/lower extremities   Sensation intact in upper/lower extremities   Normal gait.  CN I not tested  CN II not tested CN III, IV, VI PERRLA and EOMs intact bilaterally  CN V Intact sensation to sharp and light touch to the face  CN VII facial movements symmetric  CN VIII not tested  CN IX, X no uvula deviation, symmetric rise of soft palate  CN XI 5/5 SCM and trapezius strength bilaterally  CN XII Midline tongue protrusion, symmetric L/R movements     Psychiatric:        Mood and Affect: Mood normal.     ED Results / Procedures / Treatments   Labs (all labs ordered are listed, but only abnormal results are displayed) Labs Reviewed  RESP PANEL BY RT-PCR (RSV, FLU A&B, COVID)  [id number]    EKG None  Radiology DG Chest 2 View Result Date:  03/28/2023 CLINICAL DATA:  Cough and sinus congestion. EXAM: CHEST - 2 VIEW COMPARISON:  Chest CT 03/28/2022 FINDINGS: Minimal ill-defined opacity at the left lung base. The heart is normal in size. Mediastinal contours are normal. No pleural effusion or pneumothorax. Normal pulmonary vasculature. No acute osseous findings. IMPRESSION: Minimal ill-defined opacity at the left lung base, atelectasis versus pneumonia. Electronically Signed   By: Andrea Gasman M.D.   On: 03/28/2023 17:40    Procedures Procedures    Medications Ordered in ED Medications  ketorolac  (TORADOL ) 30 MG/ML injection 30 mg (30 mg Intramuscular Given 03/28/23 1806)    ED Course/ Medical Decision Making/ A&P                                 Medical Decision Making Amount and/or Complexity of Data Reviewed Radiology: ordered.  Risk Prescription drug management.   This patient presents to the ED for concern of sinus pressure, cough, headache, this involves an extensive number of treatment options, and is a complaint that carries with it a high risk of complications and morbidity.  The differential diagnosis includes viral URI, COVID, flu, RSV, pneumonia, asthma, COPD, other   Co morbidities that complicate the patient evaluation  See HPI   Additional history obtained:  Additional history obtained from EMR External records from outside source obtained and reviewed including hospital records   Lab Tests:  I Ordered, and personally interpreted labs.  The pertinent results include: Viral testing negative   Imaging Studies ordered:  I ordered imaging studies including chest ray I independently visualized and interpreted imaging which showed left lower lobe concerning for atelectasis versus pneumonia I agree with the radiologist interpretation   Cardiac Monitoring: / EKG:  The patient was maintained on a cardiac monitor.  I personally viewed and interpreted the cardiac monitored which showed an underlying  rhythm of: Sinus   Consultations Obtained:  N/a   Problem List / ED Course / Critical interventions / Medication management  Pneumonia, cough, headache I ordered medication including Toradol   Reevaluation of the patient after these medicines showed that the patient improved I have reviewed the patients home medicines and have made adjustments as needed   Social Determinants of Health:  Denies tobacco, illicit drug use   Test / Admission - Considered:  Pneumonia, cough, headache Vitals signs within normal range and stable throughout visit. Laboratory/imaging studies significant for: See above 58 year old female presents emergency department with complaints of weeklong cough, sinus pressure, intermittent headache.  On exam, lungs with faint expiratory wheeze with some decreased lung sounding left greater than right bases.  Patient also with some maxillary sinus tenderness to palpation.  Given duration of sinus pressure, will treat empirically for bacterial sinusitis.  Chest x-ray concerning for developing pneumonia; will also provide coverage for CAP antibiotics with chosen.  Patient additionally with slight faint expiratory wheeze appreciated bilateral lung fields.  Will give short course of prednisone  for treatment of mild exacerbation in addition to patient's at home breathing treatments.  Regarding headache, frontal in nature.  Nonfocal neuroexam.  Seems to be worsened as coughing is present; more suspicious for migraine type headache.  No evidence of meningismus/altered mentation concerning for meningitis/encephalitis.  Follow-up with PCP recommended for reevaluation.  Patient with normal vital signs within normal limits; no meeting of SIRS criteria so further workup via labs not performed.  Treatment plan discussed at length with patient and she acknowledged understanding was agreeable to said plan.  Patient overall well-appearing, nonhypoxic, afebrile in no acute distress. Worrisome  signs and symptoms were discussed with the patient, and the patient acknowledged understanding to return to the ED if noticed. Patient was stable upon discharge.          Final Clinical Impression(s) / ED Diagnoses Final diagnoses:  Pneumonia due to infectious organism, unspecified laterality, unspecified part of lung  Cough, unspecified type  Acute nonintractable headache, unspecified headache type    Rx / DC Orders      Silver Wonda LABOR, [place] 03/28/23 AVA Jerral Meth, MD 03/29/23 1610

## 2023-03-28 NOTE — ED Notes (Signed)
 RN reviewed discharge instructions with pt. Pt verbalized understanding and had no further questions. VSS upon discharge.

## 2023-03-28 NOTE — ED Provider Triage Note (Signed)
 Emergency Medicine Provider Triage Evaluation Note  Kristen Heath , a 58 y.o. female  was evaluated in triage.  Pt complains of sinus pressure, productive cough x 1 week. H/o asthma, using home medications. Reports wheezing at times. No fevers, CP, leg swelling. Reports blood sugars 90's - 130's today.   Review of Systems  Positive: Cough, facial pressure Negative: fever  Physical Exam  BP 125/85 (BP Location: Left Arm)   Pulse 92   Temp 97.7 F (36.5 C)   Resp 16   Wt 86.2 kg   LMP 01/22/2013   SpO2 99%   BMI 32.61 kg/m  Gen:   Awake, no distress   Resp:  Normal effort  MSK:   Moves extremities without difficulty  Other:  Lungs CTAB  Medical Decision Making  Medically screening exam initiated at 4:16 PM.  Appropriate orders placed.  Kristen Heath was informed that the remainder of the evaluation will be completed by another provider, this initial triage assessment does not replace that evaluation, and the importance of remaining in the ED until their evaluation is complete.  She would like breathing treatment. PO prednisone  ordered.    Kristen Chew, PA-C 03/28/23 [phone]

## 2023-03-28 NOTE — Discharge Instructions (Signed)
 As discussed, your chest x-ray did not show evidence of pneumonia.  Will treat this with antibiotics in the outpatient setting.  Given slight wheezing heard on exam, will also send him with a short course of prednisone  for treatment of asthma exacerbation.  Will also send in cough suppressant to use mainly at night.  He wants you to continue to cough as this helps with with resolution of pneumonia.  Will also send a medicine similar to we gave you while in emergency department for treatment of headache.  Recommend follow-up with your primary care for reassessment of your symptoms.  Please do not hesitate to return if the worrisome signs and symptoms we discussed become apparent.

## 2023-03-28 NOTE — ED Triage Notes (Signed)
 Pt c/o HA, sinus congestion and drainage with cough x 1 week. Denies fever, Tylenol at 1000 with no relief

## 2023-04-25 ENCOUNTER — Telehealth: Payer: Self-pay | Admitting: Gastroenterology

## 2023-04-25 NOTE — Telephone Encounter (Signed)
Patient called and stated that she lost her prep instruction and in her mychart there is no letters. Patient is requesting we send over those instruction through her mychart. Patient is also requesting a call back to notify her that it has been sent to her. Please advise.

## 2023-04-25 NOTE — Telephone Encounter (Signed)
Instructions made available via mychart.

## 2023-05-02 ENCOUNTER — Ambulatory Visit (AMBULATORY_SURGERY_CENTER): Payer: No Typology Code available for payment source | Admitting: Gastroenterology

## 2023-05-02 ENCOUNTER — Encounter: Payer: Self-pay | Admitting: Gastroenterology

## 2023-05-02 VITALS — BP 111/66 | HR 67 | Temp 97.3°F | Resp 9 | Ht 64.0 in | Wt 201.0 lb

## 2023-05-02 DIAGNOSIS — R112 Nausea with vomiting, unspecified: Secondary | ICD-10-CM

## 2023-05-02 DIAGNOSIS — K219 Gastro-esophageal reflux disease without esophagitis: Secondary | ICD-10-CM

## 2023-05-02 DIAGNOSIS — K648 Other hemorrhoids: Secondary | ICD-10-CM

## 2023-05-02 DIAGNOSIS — K5904 Chronic idiopathic constipation: Secondary | ICD-10-CM | POA: Diagnosis not present

## 2023-05-02 DIAGNOSIS — K2289 Other specified disease of esophagus: Secondary | ICD-10-CM

## 2023-05-02 DIAGNOSIS — K5730 Diverticulosis of large intestine without perforation or abscess without bleeding: Secondary | ICD-10-CM | POA: Diagnosis not present

## 2023-05-02 MED ORDER — SODIUM CHLORIDE 0.9 % IV SOLN: 500.0000 mL | INTRAVENOUS | Status: DC

## 2023-05-02 NOTE — Progress Notes (Signed)
 History and Physical:  This patient presents for endoscopic testing for: Encounter Diagnoses  Name Primary?   Chronic idiopathic constipation Yes   Gastroesophageal reflux disease, unspecified whether esophagitis present     58 year old woman here today for endoscopic testing to evaluate multiple digestive symptoms outlined in our office consult note dated 03/12/2023. Nausea, vomiting, refractory heartburn, bloating and constipation  Patient is otherwise without complaints or active issues today.   Past Medical History: Past Medical History:  Diagnosis Date   Allergy    Anemia    Anxiety    no current meds   Arthritis    knees   Asthma    cough induced asthma   CPAP (continuous positive airway pressure) dependence    Depression    Dry skin    Gestational diabetes    Headache(784.0)    Migraines   History of kidney stones    no surgery - passed stone   Hyperlipidemia    Hypertension    Pre-diabetes    PTSD (post-traumatic stress disorder)    Seasonal allergies    Sleep apnea    recent diagnosis 01/18/13      Past Surgical History: Past Surgical History:  Procedure Laterality Date   ABDOMINAL HYSTERECTOMY N/A 02/09/2013   Procedure: HYSTERECTOMY ABDOMINAL;  Surgeon: Norleen GORMAN Skill, MD;  Location: [facility];  Service: Gynecology;  Laterality: N/A;   arthroscopic right knee      CESAREAN SECTION  1993   COLONOSCOPY     CYSTOSCOPY N/A 05/08/2016   Procedure: CYSTOSCOPY;  Surgeon: Norleen Skill, MD;  Location: [facility];  Service: Gynecology;  Laterality: N/A;   LAPAROSCOPIC SALPINGO OOPHERECTOMY Bilateral 05/08/2016   Procedure: ATTEMPTED LAPAROSCOPIC SALPINGO OOPHORECTOMY;  Surgeon: Norleen Skill, MD;  Location: [facility];  Service: Gynecology;  Laterality: Bilateral;  IVAR Dollar RNFA confirmed 04/16/16   MYOMECTOMY ABDOMINAL APPROACH     SALPINGOOPHORECTOMY  05/08/2016   Procedure: BILATERAL SALPINGO OOPHORECTOMY,OPEN;  Surgeon: Norleen Skill, MD;  Location: [facility];  Service:  Gynecology;;   UNILATERAL SALPINGECTOMY Right 02/09/2013   Procedure: UNILATERAL SALPINGECTOMY;  Surgeon: Norleen GORMAN Skill, MD;  Location: [facility];  Service: Gynecology;  Laterality: Right;   WISDOM TOOTH EXTRACTION      Allergies: Allergies  Allergen Reactions   Modafinil Rash and Hives   Niacin And Related Other (See Comments)   Niacin Rash and Other (See Comments)    flushing    Outpatient Meds: Current Outpatient Medications  Medication Sig Dispense Refill   albuterol  (ACCUNEB ) 0.63 MG/3ML nebulizer solution Take 3 mLs (0.63 mg total) by nebulization every 6 (six) hours as needed for wheezing. 75 mL 12   albuterol  (PROVENTIL  HFA;VENTOLIN  HFA) 108 (90 BASE) MCG/ACT inhaler Inhale 2 puffs into the lungs every 6 (six) hours as needed for wheezing or shortness of breath.      betamethasone valerate ointment (VALISONE) 0.1 % Apply 1 Application topically daily.     lidocaine  (LIDODERM ) 5 % Place 1 patch onto the skin daily.     lisinopril-hydrochlorothiazide (ZESTORETIC) 10-12.5 MG tablet Take 1 tablet by mouth daily.     SYNJARDY XR 12.07-998 MG TB24 Take 1 tablet by mouth daily.     amoxicillin -clavulanate (AUGMENTIN ) 875-125 MG tablet Take 1 tablet by mouth every 12 (twelve) hours. (Patient not taking: Reported on 05/02/2023) 20 tablet 0   ARIPiprazole (ABILIFY) 10 MG tablet Take 10 mg by mouth.     atorvastatin (LIPITOR) 20 MG tablet Take 20 mg by mouth daily at 6 PM.  azelastine (ASTELIN) 0.1 % nasal spray Place 1 spray into both nostrils.     azithromycin  (ZITHROMAX ) 250 MG tablet Take 1 tablet (250 mg total) by mouth daily. Take first 2 tablets together, then 1 every day until finished. 6 tablet 0   benzonatate  (TESSALON ) 100 MG capsule Take 1 capsule (100 mg total) by mouth 3 (three) times daily as needed. 21 capsule 0   buPROPion (WELLBUTRIN XL) 150 MG 24 hr tablet Take 150 mg by mouth daily.     Capsaicin 0.1 % CREA Apply topically.     celecoxib  (CELEBREX ) 200 MG capsule Take 1  capsule (200 mg total) by mouth 2 (two) times daily. 20 capsule 0   cholecalciferol (VITAMIN D) 1000 units tablet Take 2,000 Units by mouth daily.     cyclobenzaprine  (FLEXERIL ) 10 MG tablet Take 0.5-1 tablets (5-10 mg total) by mouth 2 (two) times daily as needed for muscle spasms. 20 tablet 0   diclofenac Sodium (VOLTAREN) 1 % GEL diclofenac 1 % topical gel     ferrous sulfate 325 (65 FE) MG tablet Take 325 mg by mouth daily with breakfast.     fluticasone  (FLONASE ) 50 MCG/ACT nasal spray Place 2 sprays into both nostrils daily. 16 g 2   gabapentin (NEURONTIN) 300 MG capsule Take 300 mg by mouth 3 (three) times daily.     ibuprofen  (ADVIL ) 400 MG tablet Take 400 mg by mouth every 8 (eight) hours as needed.     ipratropium (ATROVENT ) 0.06 % nasal spray Place 1 spray into the nose.     loratadine (CLARITIN) 10 MG tablet Take 10 mg by mouth daily.     Meclizine HCl 25 MG CHEW one tablet (25 mg dose).     Melatonin 3 MG CAPS Take by mouth.     methocarbamol  (ROBAXIN ) 500 MG tablet Take 1 tablet (500 mg total) by mouth 2 (two) times daily as needed for muscle spasms. 20 tablet 0   mometasone-formoterol (DULERA) 200-5 MCG/ACT AERO Inhale 2 puffs into the lungs.     montelukast (SINGULAIR) 10 MG tablet Take 10 mg by mouth at bedtime.     naproxen (NAPROSYN) 500 MG tablet Take 500 mg by mouth 2 (two) times daily with a meal.     omeprazole (PRILOSEC) 20 MG capsule Take 20 mg by mouth daily.     ondansetron  (ZOFRAN -ODT) 4 MG disintegrating tablet Take 1 tablet (4 mg total) by mouth every 8 (eight) hours as needed for nausea or vomiting. 10 tablet 0   polyethylene glycol (MIRALAX / GLYCOLAX) packet Take 17 g by mouth daily.     QUEtiapine (SEROQUEL) 100 MG tablet Take by mouth.     triamcinolone  (KENALOG ) 0.025 % ointment Apply 1 Application topically 2 (two) times daily.     zolpidem  (AMBIEN  CR) 6.25 MG CR tablet Take 6.25 mg by mouth at bedtime as needed.     Current Facility-Administered  Medications  Medication Dose Route Frequency Provider Last Rate Last Admin   0.9 %  sodium chloride  infusion  500 mL Intravenous Continuous Legrand Victory CROME III, MD          ___________________________________________________________________ Objective   Exam:  BP (!) 131/92   Pulse 67   Temp (!) 97.3 F (36.3 C)   Ht 5' 4 (1.626 m)   Wt 201 lb (91.2 kg)   LMP 01/22/2013   SpO2 98%   BMI 34.50 kg/m   CV: regular , S1/S2 Resp: clear to auscultation bilaterally, normal RR  and effort noted GI: soft, no tenderness, with active bowel sounds.   Assessment: Encounter Diagnoses  Name Primary?   Chronic idiopathic constipation Yes   Gastroesophageal reflux disease, unspecified whether esophagitis present      Plan: Colonoscopy EGD  The benefits and risks of the planned procedure were described in detail with the patient or (when appropriate) their health care proxy.  Risks were outlined as including, but not limited to, bleeding, infection, perforation, adverse medication reaction leading to cardiac or pulmonary decompensation, pancreatitis (if ERCP).  The limitation of incomplete mucosal visualization was also discussed.  No guarantees or warranties were given.  The patient is appropriate for an endoscopic procedure in the ambulatory setting.   - Victory Brand, MD

## 2023-05-02 NOTE — Progress Notes (Signed)
 Pt's states no medical or surgical changes since previsit or office visit.

## 2023-05-02 NOTE — Patient Instructions (Addendum)
 - Resume previous diet. - Continue present medications. - Do a gastric emptying study at appointment to be scheduled. - [facility] and bring these reports with you to the visit. - Reinforce anti-GERD diet and lifestyle measures.  YOU HAD AN ENDOSCOPIC PROCEDURE TODAY AT THE [facility]:   Refer to the procedure report that was given to you for any specific questions about what was found during the examination.  If the procedure report does not answer your questions, please call your gastroenterologist to clarify.  If you requested that your care partner not be given the details of your procedure findings, then the procedure report has been included in a sealed envelope for you to review at your convenience later.  YOU SHOULD EXPECT: Some feelings of bloating in the abdomen. Passage of more gas than usual.  Walking can help get rid of the air that was put into your GI tract during the procedure and reduce the bloating. If you had a lower endoscopy (such as a colonoscopy or flexible sigmoidoscopy) you may notice spotting of blood in your stool or on the toilet paper. If you underwent a bowel prep for your procedure, you may not have a normal bowel movement for a few days.  Please Note:  You might notice some irritation and congestion in your nose or some drainage.  This is from the oxygen used during your procedure.  There is no need for concern and it should clear up in a day or so.  SYMPTOMS TO REPORT IMMEDIATELY:  Following lower endoscopy (colonoscopy or flexible sigmoidoscopy):  Excessive amounts of blood in the stool  Significant tenderness or worsening of abdominal pains  Swelling of the abdomen that is new, acute  Fever of 100F or higher  Following upper endoscopy (EGD)  Vomiting of blood or coffee ground material  New chest pain or pain under the shoulder blades  Painful or persistently difficult swallowing  New shortness of  breath  Fever of 100F or higher  Black, tarry-looking stools  For urgent or emergent issues, a gastroenterologist can be reached at any hour by calling [phone]. Do not use MyChart messaging for urgent concerns.    DIET:  We do recommend a small meal at first, but then you may proceed to your regular diet.  Drink plenty of fluids but you should avoid alcoholic beverages for 24 hours.  ACTIVITY:  You should plan to take it easy for the rest of today and you should NOT DRIVE or use heavy machinery until tomorrow (because of the sedation medicines used during the test).    FOLLOW UP: Our staff will call the number listed on your records the next business day following your procedure.  We will call around 7:15- 8:00 am to check on you and address any questions or concerns that you may have regarding the information given to you following your procedure. If we do not reach you, we will leave a message.     If any biopsies were taken you will be contacted by phone or by letter within the next 1-3 weeks.  Please call us  at [phone] if you have not heard about the biopsies in 3 weeks.    SIGNATURES/CONFIDENTIALITY: You and/or your care partner have signed paperwork which will be entered into your electronic medical record.  These signatures attest to the fact that that the information above on your After Visit Summary has been reviewed and is understood.  Full  responsibility of the confidentiality of this discharge information lies with you and/or your care-partner.

## 2023-05-02 NOTE — Progress Notes (Signed)
 A/o x 3, VSS, gd SR's, pleased with anesthesia, report to RN

## 2023-05-02 NOTE — Op Note (Signed)
 [facility] Patient Name: Kristen Heath Procedure Date: 05/02/2023 1:33 PM MRN: [id number] Endoscopist: Victory L. Legrand , MD, [id number] Age: 59 Referring MD:  Date of Birth: [birth year] Gender: Female Account #: [id number] Procedure:                Upper GI endoscopy Indications:              Esophageal reflux symptoms that persist despite                            appropriate therapy, Nausea with vomiting Medicines:                Monitored Anesthesia Care Procedure:                Pre-Anesthesia Assessment:                           - Prior to the procedure, a History and Physical                            was performed, and patient medications and                            allergies were reviewed. The patient's tolerance of                            previous anesthesia was also reviewed. The risks                            and benefits of the procedure and the sedation                            options and risks were discussed with the patient.                            All questions were answered, and informed consent                            was obtained. Prior Anticoagulants: The patient has                            taken no anticoagulant or antiplatelet agents. ASA                            Grade Assessment: II - A patient with mild systemic                            disease. After reviewing the risks and benefits,                            the patient was deemed in satisfactory condition to                            undergo the procedure.  After obtaining informed consent, the endoscope was                            passed under direct vision. Throughout the                            procedure, the patient's blood pressure, pulse, and                            oxygen saturations were monitored continuously. The                            GIF [id number] #[id number] was introduced through the                            mouth, and  advanced to the second part of duodenum.                            The upper GI endoscopy was accomplished without                            difficulty. The patient tolerated the procedure                            well. Scope In: Scope Out: Findings:                 The larynx was normal.                           The examined duodenum was normal.                           The stomach was normal.                           The cardia and gastric fundus were normal on                            retroflexion.                           Incidental inlet patch in the proximal esophagus.                            Otherwise normal esophagus. Complications:            No immediate complications. Estimated Blood Loss:     Estimated blood loss: none. Impression:               - Normal larynx.                           - Normal esophagus other than incidental inlet                            patch.                           -  Normal stomach.                           - Normal examined duodenum.                           - No specimens collected. Recommendation:           - Resume previous diet.                           - Continue present medications.                           - Do a gastric emptying study at appointment to be                            scheduled.                           - [facility]                            and bring these reports with you to the visit.                           - Reinforce anti-GERD diet and lifestyle measures. Marissa Weaver L. Legrand, MD 05/02/2023 2:15:02 PM This report has been signed electronically.

## 2023-05-02 NOTE — Op Note (Signed)
 [facility] Patient Name: Kristen Heath Procedure Date: 05/02/2023 1:34 PM MRN: [id number] Endoscopist: Victory L. Legrand , MD, [id number] Age: 58 Referring MD:  Date of Birth: 1965-10-28 Gender: Female Account #: [id number] Procedure:                Colonoscopy Indications:              Lower abdominal pain, Constipation Medicines:                Monitored Anesthesia Care Procedure:                Pre-Anesthesia Assessment:                           - Prior to the procedure, a History and Physical                            was performed, and patient medications and                            allergies were reviewed. The patient's tolerance of                            previous anesthesia was also reviewed. The risks                            and benefits of the procedure and the sedation                            options and risks were discussed with the patient.                            All questions were answered, and informed consent                            was obtained. Prior Anticoagulants: The patient has                            taken no anticoagulant or antiplatelet agents. ASA                            Grade Assessment: II - A patient with mild systemic                            disease. After reviewing the risks and benefits,                            the patient was deemed in satisfactory condition to                            undergo the procedure.                           After obtaining informed consent, the colonoscope  was passed under direct vision. Throughout the                            procedure, the patient's blood pressure, pulse, and                            oxygen saturations were monitored continuously. The                            CF [id number] #[id number] was introduced through the anus                            and advanced to the the cecum, identified by                            appendiceal orifice  and ileocecal valve. The                            colonoscopy was performed with difficulty due to a                            redundant colon and significant looping. Successful                            completion of the procedure was aided by using                            manual pressure and straightening and shortening                            the scope to obtain bowel loop reduction. The                            patient tolerated the procedure well. The quality                            of the bowel preparation was excellent. The                            ileocecal valve, appendiceal orifice, and rectum                            were photographed. The bowel preparation used was 2                            day Suprep/Miralax via split dose instruction. Scope In: 1:37:29 PM Scope Out: 1:57:01 PM Scope Withdrawal Time: 0 hours 11 minutes 38 seconds  Total Procedure Duration: 0 hours 19 minutes 32 seconds  Findings:                 The perianal and digital rectal examinations were                            normal.  Repeat examination of right colon under NBI                            performed.                           The colon (entire examined portion) was redundant.                           Multiple diverticula were found in the entire colon.                           Internal hemorrhoids were found. The hemorrhoids                            were small.                           The exam was otherwise without abnormality on                            direct and retroflexion views. Complications:            No immediate complications. Estimated Blood Loss:     Estimated blood loss: none. Impression:               - Redundant colon.                           - Diverticulosis in the entire examined colon.                           - Internal hemorrhoids.                           - The examination was otherwise normal on direct                             and retroflexion views.                           - No specimens collected.                           Chronic idiopathic constipation. Recommendation:           - Patient has a contact number available for                            emergencies. The signs and symptoms of potential                            delayed complications were discussed with the                            patient. Return to normal activities tomorrow.  Written discharge instructions were provided to the                            patient.                           - Resume previous diet.                           - Continue present medications.                           - See the other procedure note for documentation of                            additional recommendations.                           - Continue current treamtent for constipation,                            contact [facility] for an appointment and                            bring these reports with you to that visit. Haru Shaff L. Legrand, MD 05/02/2023 2:01:05 PM This report has been signed electronically.

## 2023-05-03 ENCOUNTER — Telehealth: Payer: Self-pay

## 2023-05-03 NOTE — Telephone Encounter (Signed)
Post procedure follow up call, left message

## 2023-05-13 ENCOUNTER — Other Ambulatory Visit: Payer: Self-pay

## 2023-05-13 DIAGNOSIS — R112 Nausea with vomiting, unspecified: Secondary | ICD-10-CM

## 2023-05-13 DIAGNOSIS — K219 Gastro-esophageal reflux disease without esophagitis: Secondary | ICD-10-CM

## 2023-05-22 ENCOUNTER — Encounter (HOSPITAL_COMMUNITY)
Admission: RE | Admit: 2023-05-22 | Discharge: 2023-05-22 | Disposition: A | Discharge status: Home / Self Care | Payer: No Typology Code available for payment source | Source: Ambulatory Visit | Attending: Gastroenterology | Admitting: Gastroenterology

## 2023-05-22 DIAGNOSIS — K219 Gastro-esophageal reflux disease without esophagitis: Secondary | ICD-10-CM | POA: Diagnosis present

## 2023-05-22 DIAGNOSIS — R112 Nausea with vomiting, unspecified: Secondary | ICD-10-CM | POA: Diagnosis present

## 2023-05-22 MED ORDER — TECHNETIUM TC 99M SULFUR COLLOID
2.1000 | Freq: Once | INTRAVENOUS | Status: AC
  Administered 2023-05-22: 2.1 via ORAL

## 2023-05-24 ENCOUNTER — Other Ambulatory Visit: Payer: Self-pay | Admitting: *Deleted

## 2023-05-24 ENCOUNTER — Telehealth: Payer: Self-pay | Admitting: Gastroenterology

## 2023-05-24 MED ORDER — METRONIDAZOLE 500 MG PO TABS: 500.0000 mg | ORAL_TABLET | Freq: Two times a day (BID) | ORAL | 0 refills | Status: DC

## 2023-05-24 MED ORDER — CIPROFLOXACIN HCL 500 MG PO TABS: 500.0000 mg | ORAL_TABLET | Freq: Two times a day (BID) | ORAL | 0 refills | Status: DC

## 2023-05-24 MED ORDER — METRONIDAZOLE 500 MG PO TABS: 500.0000 mg | ORAL_TABLET | Freq: Two times a day (BID) | ORAL | 0 refills | Status: AC

## 2023-05-24 MED ORDER — CIPROFLOXACIN HCL 500 MG PO TABS: 500.0000 mg | ORAL_TABLET | Freq: Two times a day (BID) | ORAL | 0 refills | Status: AC

## 2023-05-24 NOTE — Telephone Encounter (Signed)
 [facility] called stating Cipro medication to be resent to the [facility] center. A good call back number [phone].

## 2023-05-24 NOTE — Telephone Encounter (Signed)
 Rx resent to [facility] as per [place] request.

## 2023-08-16 ENCOUNTER — Other Ambulatory Visit: Payer: Self-pay | Admitting: Urology

## 2023-08-16 DIAGNOSIS — R3129 Other microscopic hematuria: Secondary | ICD-10-CM

## 2023-08-21 ENCOUNTER — Encounter: Payer: Self-pay | Admitting: Urology

## 2023-08-30 ENCOUNTER — Ambulatory Visit
Admission: RE | Admit: 2023-08-30 | Discharge: 2023-08-30 | Disposition: A | Discharge status: Home / Self Care | Source: Ambulatory Visit | Attending: Urology | Admitting: Urology

## 2023-08-30 DIAGNOSIS — R3129 Other microscopic hematuria: Secondary | ICD-10-CM

## 2023-09-04 ENCOUNTER — Encounter: Payer: Self-pay | Admitting: Gastroenterology

## 2023-12-28 ENCOUNTER — Emergency Department (HOSPITAL_BASED_OUTPATIENT_CLINIC_OR_DEPARTMENT_OTHER)

## 2023-12-28 ENCOUNTER — Emergency Department (HOSPITAL_BASED_OUTPATIENT_CLINIC_OR_DEPARTMENT_OTHER)
Admission: EM | Admit: 2023-12-28 | Discharge: 2023-12-28 | Disposition: A | Discharge status: Home / Self Care | Attending: Emergency Medicine | Admitting: Emergency Medicine

## 2023-12-28 DIAGNOSIS — I10 Essential (primary) hypertension: Secondary | ICD-10-CM | POA: Insufficient documentation

## 2023-12-28 DIAGNOSIS — M25561 Pain in right knee: Secondary | ICD-10-CM | POA: Diagnosis present

## 2023-12-28 DIAGNOSIS — M545 Low back pain, unspecified: Secondary | ICD-10-CM | POA: Insufficient documentation

## 2023-12-28 DIAGNOSIS — M25522 Pain in left elbow: Secondary | ICD-10-CM | POA: Insufficient documentation

## 2023-12-28 DIAGNOSIS — Z79899 Other long term (current) drug therapy: Secondary | ICD-10-CM | POA: Insufficient documentation

## 2023-12-28 DIAGNOSIS — M25562 Pain in left knee: Secondary | ICD-10-CM | POA: Diagnosis not present

## 2023-12-28 DIAGNOSIS — J45909 Unspecified asthma, uncomplicated: Secondary | ICD-10-CM | POA: Insufficient documentation

## 2023-12-28 DIAGNOSIS — M25512 Pain in left shoulder: Secondary | ICD-10-CM | POA: Insufficient documentation

## 2023-12-28 DIAGNOSIS — Y92019 Unspecified place in single-family (private) house as the place of occurrence of the external cause: Secondary | ICD-10-CM | POA: Insufficient documentation

## 2023-12-28 DIAGNOSIS — Z7951 Long term (current) use of inhaled steroids: Secondary | ICD-10-CM | POA: Diagnosis not present

## 2023-12-28 DIAGNOSIS — W19XXXA Unspecified fall, initial encounter: Secondary | ICD-10-CM | POA: Insufficient documentation

## 2023-12-28 DIAGNOSIS — T07XXXA Unspecified multiple injuries, initial encounter: Secondary | ICD-10-CM

## 2023-12-28 DIAGNOSIS — M25552 Pain in left hip: Secondary | ICD-10-CM | POA: Diagnosis not present

## 2023-12-28 MED ORDER — IBUPROFEN 400 MG PO TABS
400.0000 mg | ORAL_TABLET | Freq: Once | ORAL | Status: AC
Start: 2023-12-28 — End: 2023-12-28
  Administered 2023-12-28: 400 mg via ORAL
  Filled 2023-12-28: qty 1

## 2023-12-28 MED ORDER — CYCLOBENZAPRINE HCL 10 MG PO TABS: 10.0000 mg | ORAL_TABLET | Freq: Two times a day (BID) | ORAL | 0 refills | Status: AC | PRN

## 2023-12-28 MED ORDER — CYCLOBENZAPRINE HCL 10 MG PO TABS
10.0000 mg | ORAL_TABLET | Freq: Once | ORAL | Status: AC
Start: 2023-12-28 — End: 2023-12-28
  Administered 2023-12-28: 10 mg via ORAL
  Filled 2023-12-28: qty 1

## 2023-12-28 MED ORDER — ACETAMINOPHEN 500 MG PO TABS
1000.0000 mg | ORAL_TABLET | Freq: Once | ORAL | Status: AC
  Administered 2023-12-28: 1000 mg via ORAL
  Filled 2023-12-28: qty 2

## 2023-12-28 MED ORDER — MELOXICAM 7.5 MG PO TABS: 7.5000 mg | ORAL_TABLET | Freq: Every day | ORAL | 0 refills | Status: AC

## 2023-12-28 NOTE — Discharge Instructions (Signed)
 All the x-rays today look normal you are bruised and sore but no broken bones.  You can use the muscle relaxer and anti-inflammatory as needed for pain and spasm.  Follow-up with your doctor if it is not improving

## 2023-12-28 NOTE — ED Triage Notes (Signed)
 Arrived by POV. Frequent fall, most recent was 12/27/23(yesterday) while getting out of bed, currently complaining of dizziness and  left sided pain (knee,shoulder,hip,ankle). Nauseous, no LOC or vomiting. Tylenol  taken last night.

## 2023-12-28 NOTE — ED Provider Notes (Signed)
 [place] EMERGENCY DEPARTMENT AT [facility] Provider Note   CSN: [id number] Arrival date & time: 12/28/23  9260     Patient presents with: Knee Pain   Kristen Heath is a 58 y.o. female.   Patient is a 58 year old female with a history of hypertension, hyperlipidemia, asthma who is presenting today after a fall.  Patient reports that she fell yesterday because her right leg gave out which happens often because she has bad knees and is wearing knee braces and using her rollator.  Yesterday she fell on her left side onto the carpet but also hit the bedpost on her way down.  She did not hit her head or neck.  However since that time she has been having significant pain in her left shoulder and elbow as well as her left hip and knee.  She reports her knee and ankle hurt all the time and she is not sure that it is significantly worse than baseline.  She was taking muscle relaxer for her pain but ran out.  She is also having low back pain which has been present for a while.  No chest or rib pain or shortness of breath.  She does not take anticoagulation.  The history is provided by the patient and medical records.  Knee Pain      Prior to Admission medications   Medication Sig Start Date End Date Taking? Authorizing Provider  cyclobenzaprine  (FLEXERIL ) 10 MG tablet Take 1 tablet (10 mg total) by mouth 2 (two) times daily as needed for muscle spasms. 12/28/23  Yes Preston Garabedian, Benton, MD  meloxicam (MOBIC) 7.5 MG tablet Take 1 tablet (7.5 mg total) by mouth daily. 12/28/23  Yes Doretha Benton, MD  albuterol  (ACCUNEB ) 0.63 MG/3ML nebulizer solution Take 3 mLs (0.63 mg total) by nebulization every 6 (six) hours as needed for wheezing. 07/12/22   Kassie Acquanetta Bradley, MD  albuterol  (PROVENTIL  HFA;VENTOLIN  HFA) 108 (90 BASE) MCG/ACT inhaler Inhale 2 puffs into the lungs every 6 (six) hours as needed for wheezing or shortness of breath.     [provider]  amoxicillin -clavulanate  (AUGMENTIN ) 875-125 MG tablet Take 1 tablet by mouth every 12 (twelve) hours. Patient not taking: Reported on 05/02/2023 03/28/23   Silver Fell A, PA  ARIPiprazole (ABILIFY) 10 MG tablet Take 10 mg by mouth. 09/10/22   [provider]  atorvastatin (LIPITOR) 20 MG tablet Take 20 mg by mouth daily at 6 PM.    [provider]  azelastine (ASTELIN) 0.1 % nasal spray Place 1 spray into both nostrils. 05/17/21   [provider]  azithromycin  (ZITHROMAX ) 250 MG tablet Take 1 tablet (250 mg total) by mouth daily. Take first 2 tablets together, then 1 every day until finished. 03/28/23   Silver Fell LABOR, PA  benzonatate  (TESSALON ) 100 MG capsule Take 1 capsule (100 mg total) by mouth 3 (three) times daily as needed. 03/28/23   Silver Fell A, PA  betamethasone valerate ointment (VALISONE) 0.1 % Apply 1 Application topically daily. 04/02/22   [provider]  buPROPion (WELLBUTRIN XL) 150 MG 24 hr tablet Take 150 mg by mouth daily. 05/16/22   [provider]  Capsaicin 0.1 % CREA Apply topically.    [provider]  cholecalciferol (VITAMIN D) 1000 units tablet Take 2,000 Units by mouth daily.    [provider]  diclofenac Sodium (VOLTAREN) 1 % GEL diclofenac 1 % topical gel    [provider]  ferrous sulfate 325 (65 FE)  MG tablet Take 325 mg by mouth daily with breakfast.    [provider]  fluticasone  (FLONASE ) 50 MCG/ACT nasal spray Place 2 sprays into both nostrils daily. 03/28/14   Dansie, William, PA-C  gabapentin (NEURONTIN) 300 MG capsule Take 300 mg by mouth 3 (three) times daily. 11/08/22   [provider]  ipratropium (ATROVENT ) 0.06 % nasal spray Place 1 spray into the nose. 07/13/22   [provider]  lidocaine  (LIDODERM ) 5 % Place 1 patch onto the skin daily. 09/10/22   [provider]  lisinopril-hydrochlorothiazide (ZESTORETIC) 10-12.5 MG tablet Take 1 tablet by mouth daily. 12/17/17    [provider]  loratadine (CLARITIN) 10 MG tablet Take 10 mg by mouth daily. 07/03/22   [provider]  Meclizine HCl 25 MG CHEW one tablet (25 mg dose). 07/13/22   [provider]  Melatonin 3 MG CAPS Take by mouth. 10/31/22   [provider]  methocarbamol  (ROBAXIN ) 500 MG tablet Take 1 tablet (500 mg total) by mouth 2 (two) times daily as needed for muscle spasms. 12/10/22   Mound, Darryle BRAVO, FNP  mometasone-formoterol (DULERA) 200-5 MCG/ACT AERO Inhale 2 puffs into the lungs. 11/13/22   [provider]  montelukast (SINGULAIR) 10 MG tablet Take 10 mg by mouth at bedtime.    [provider]  omeprazole (PRILOSEC) 20 MG capsule Take 20 mg by mouth daily.    [provider]  ondansetron  (ZOFRAN -ODT) 4 MG disintegrating tablet Take 1 tablet (4 mg total) by mouth every 8 (eight) hours as needed for nausea or vomiting. 09/20/22   Banister, Pamela K, MD  polyethylene glycol (MIRALAX / GLYCOLAX) packet Take 17 g by mouth daily.    [provider]  QUEtiapine (SEROQUEL) 100 MG tablet Take by mouth. 02/08/23   [provider]  SYNJARDY XR 12.07-998 MG TB24 Take 1 tablet by mouth daily. 01/28/23   [provider]  triamcinolone  (KENALOG ) 0.025 % ointment Apply 1 Application topically 2 (two) times daily. 10/22/22   [provider]  zolpidem  (AMBIEN  CR) 6.25 MG CR tablet Take 6.25 mg by mouth at bedtime as needed. 04/02/22   [provider]    Allergies: Modafinil, Niacin and related, and Niacin    Review of Systems  Updated Vital Signs Temp 98.1 F (36.7 C) (Oral)   Ht 5' 4 (1.626 m)   Wt 88.5 kg   LMP 01/22/2013   BMI 33.47 kg/m   Physical Exam Vitals reviewed.  Constitutional:      General: She is not in acute distress. HENT:     Head: Normocephalic.  Cardiovascular:     Rate and Rhythm: Normal rate.     Pulses: Normal pulses.  Pulmonary:     Effort: Pulmonary effort is normal. No  respiratory distress.     Breath sounds: Normal breath sounds.  Chest:     Chest wall: No tenderness.  Abdominal:     General: Abdomen is flat.     Palpations: Abdomen is soft.  Musculoskeletal:        General: Tenderness present.     Left shoulder: Tenderness and bony tenderness present. No swelling or deformity. Normal range of motion. Normal pulse.     Left elbow: Normal range of motion. Tenderness present in medial epicondyle, lateral epicondyle and olecranon process.     Left wrist: Normal.     Cervical back: Normal range of motion and neck supple. No tenderness.     Lumbar back: Tenderness  present. Normal range of motion.       Back:     Left hip: Tenderness and bony tenderness present. No deformity. Normal range of motion.     Left knee: Tenderness present over the medial joint line, lateral joint line and PCL.  Skin:    General: Skin is warm.  Neurological:     Mental Status: She is alert. Mental status is at baseline.  Psychiatric:        Mood and Affect: Mood normal.     (all labs ordered are listed, but only abnormal results are displayed) Labs Reviewed - No data to display  EKG: None  Radiology: DG Elbow Complete Left Result Date: 12/28/2023 CLINICAL DATA:  Pain. EXAM: LEFT ELBOW - COMPLETE 3+ VIEW COMPARISON:  None Available. FINDINGS: No evidence for an acute fracture. No dislocation. No joint effusion. Mild degenerative changes are seen in the radial head and medial joint space. IMPRESSION: Mild degenerative changes without acute bony findings. Electronically Signed   By: Camellia Candle M.D.   On: 12/28/2023 09:08   DG Shoulder Left Result Date: 12/28/2023 CLINICAL DATA:  Fall with pain. EXAM: LEFT SHOULDER - 2+ VIEW COMPARISON:  None Available. FINDINGS: No evidence for an acute fracture. No shoulder separation or dislocation. Degenerative changes are noted at the acromioclavicular joint and rotator cuff insertion. IMPRESSION: Degenerative changes without acute  bony findings. Electronically Signed   By: Camellia Candle M.D.   On: 12/28/2023 09:07   DG Knee Complete 4 Views Left Result Date: 12/28/2023 CLINICAL DATA:  Fall with pain. EXAM: LEFT KNEE - COMPLETE 4+ VIEW COMPARISON:  None Available. FINDINGS: No evidence for an acute fracture. No subluxation or dislocation. Degenerative spurring noted all 3 compartments. No joint effusion. IMPRESSION: Degenerative changes without acute bony findings. Electronically Signed   By: Camellia Candle M.D.   On: 12/28/2023 09:06   DG Hip Unilat W or Wo Pelvis 2-3 Views Left Result Date: 12/28/2023 CLINICAL DATA:  Fall with pain. EXAM: DG HIP (WITH OR WITHOUT PELVIS) 2-3V LEFT COMPARISON:  None Available. FINDINGS: There is no evidence of hip fracture or dislocation. There is no evidence of arthropathy or other focal bone abnormality. IMPRESSION: Negative. Electronically Signed   By: Camellia Candle M.D.   On: 12/28/2023 09:05   DG Ankle Complete Left Result Date: 12/28/2023 CLINICAL DATA:  Fall pain. EXAM: LEFT ANKLE COMPLETE - 3+ VIEW COMPARISON:  None Available. FINDINGS: Old chronic avulsion fragment adjacent to the medial malleolus. No acute fracture or dislocation. No worrisome lytic or sclerotic osseous abnormality. IMPRESSION: Negative. Electronically Signed   By: Camellia Candle M.D.   On: 12/28/2023 09:03     Procedures   Medications Ordered in the ED  cyclobenzaprine  (FLEXERIL ) tablet 10 mg (10 mg Oral Given 12/28/23 0819)  ibuprofen  (ADVIL ) tablet 400 mg (400 mg Oral Given 12/28/23 0819)  acetaminophen  (TYLENOL ) tablet 1,000 mg (1,000 mg Oral Given 12/28/23 9180)                                    Medical Decision Making Amount and/or Complexity of Data Reviewed Radiology: ordered and independent interpretation performed. Decision-making details documented in ED Course.  Risk OTC drugs. Prescription drug management.   Patient presenting after a fall at home yesterday.  No head or neck injury but is  complaining of pain on the left side of the arm and leg after the fall.  Patient was able to walk into the emergency room with her rollator but appears to be uncomfortable.  No obvious deformities or concerns for dislocation at this time.  No chest or abdominal pain concerning for rib fractures or visceral injury.  No neurologic complaints at this time.  Patient was given pain control and images are pending  9:21 AM I have independently visualized and interpreted pt's images today.  X-rays of the knee, ankle and hip are negative for fracture.  Shoulder and elbow are also negative for fracture.  Radiology reports no acute findings but degenerative changes present.  Patient was given supportive care and at this time she is stable for discharge.      Final diagnoses:  Fall, initial encounter  Contusion, multiple sites    ED Discharge Orders          Ordered    cyclobenzaprine  (FLEXERIL ) 10 MG tablet  2 times daily PRN        12/28/23 0918    meloxicam (MOBIC) 7.5 MG tablet  Daily        12/28/23 0918               Doretha Folks, MD 12/28/23 [phone]

## 2024-01-23 ENCOUNTER — Emergency Department (HOSPITAL_BASED_OUTPATIENT_CLINIC_OR_DEPARTMENT_OTHER)
Admission: EM | Admit: 2024-01-23 | Discharge: 2024-01-23 | Disposition: A | Discharge status: Home / Self Care | Attending: Emergency Medicine | Admitting: Emergency Medicine

## 2024-01-23 ENCOUNTER — Emergency Department (HOSPITAL_BASED_OUTPATIENT_CLINIC_OR_DEPARTMENT_OTHER)

## 2024-01-23 ENCOUNTER — Encounter (HOSPITAL_BASED_OUTPATIENT_CLINIC_OR_DEPARTMENT_OTHER): Payer: Self-pay

## 2024-01-23 ENCOUNTER — Other Ambulatory Visit: Payer: Self-pay

## 2024-01-23 DIAGNOSIS — M25572 Pain in left ankle and joints of left foot: Secondary | ICD-10-CM | POA: Diagnosis present

## 2024-01-23 DIAGNOSIS — M79632 Pain in left forearm: Secondary | ICD-10-CM | POA: Insufficient documentation

## 2024-01-23 DIAGNOSIS — E119 Type 2 diabetes mellitus without complications: Secondary | ICD-10-CM | POA: Diagnosis not present

## 2024-01-23 DIAGNOSIS — M79642 Pain in left hand: Secondary | ICD-10-CM | POA: Diagnosis not present

## 2024-01-23 DIAGNOSIS — M25522 Pain in left elbow: Secondary | ICD-10-CM | POA: Insufficient documentation

## 2024-01-23 DIAGNOSIS — W19XXXA Unspecified fall, initial encounter: Secondary | ICD-10-CM | POA: Diagnosis not present

## 2024-01-23 DIAGNOSIS — Z79899 Other long term (current) drug therapy: Secondary | ICD-10-CM | POA: Insufficient documentation

## 2024-01-23 DIAGNOSIS — M7918 Myalgia, other site: Secondary | ICD-10-CM

## 2024-01-23 LAB — COMPREHENSIVE METABOLIC PANEL WITH GFR
ALT: 11 U/L (ref 0–44)
AST: 16 U/L (ref 15–41)
Albumin: 4.4 g/dL (ref 3.5–5.0)
Alkaline Phosphatase: 73 U/L (ref 38–126)
Anion gap: 9 (ref 5–15)
BUN: 20 mg/dL (ref 6–20)
CO2: 28 mmol/L (ref 22–32)
Calcium: 10.2 mg/dL (ref 8.9–10.3)
Chloride: 101 mmol/L (ref 98–111)
Creatinine, Ser: 0.87 mg/dL (ref 0.44–1.00)
GFR, Estimated: >60 mL/min (ref >60)
Glucose, Bld: 103 mg/dL — ABNORMAL HIGH (ref 70–99)
Potassium: 3.5 mmol/L (ref 3.5–5.1)
Sodium: 139 mmol/L (ref 135–145)
Total Bilirubin: 0.4 mg/dL (ref 0.0–1.2)
Total Protein: 7.3 g/dL (ref 6.5–8.1)

## 2024-01-23 LAB — CBC
HCT: 40.4 % (ref 36.0–46.0)
Hemoglobin: 13.1 g/dL (ref 12.0–15.0)
MCH: 27.6 pg (ref 26.0–34.0)
MCHC: 32.4 g/dL (ref 30.0–36.0)
MCV: 85.1 fL (ref 80.0–100.0)
Platelets: 237 K/uL (ref 150–400)
RBC: 4.75 MIL/uL (ref 3.87–5.11)
RDW: 14.5 % (ref 11.5–15.5)
WBC: 4.8 K/uL (ref 4.0–10.5)
nRBC: 0 % (ref 0.0–0.2)

## 2024-01-23 LAB — URINALYSIS, ROUTINE W REFLEX MICROSCOPIC
Bacteria, UA: NONE SEEN
Bilirubin Urine: NEGATIVE
Glucose, UA: >1000 mg/dL — AB
Ketones, ur: NEGATIVE mg/dL
Leukocytes,Ua: NEGATIVE
Nitrite: NEGATIVE
Protein, ur: NEGATIVE mg/dL
Specific Gravity, Urine: 1.006 (ref 1.005–1.030)
pH: 5.5 (ref 5.0–8.0)

## 2024-01-23 LAB — CBG MONITORING, ED: Glucose-Capillary: 98 mg/dL (ref 70–99)

## 2024-01-23 MED ORDER — KETOROLAC TROMETHAMINE 15 MG/ML IJ SOLN: 15.0000 mg | Freq: Once | INTRAMUSCULAR | Status: DC

## 2024-01-23 MED ORDER — ACETAMINOPHEN 325 MG PO TABS
650.0000 mg | ORAL_TABLET | Freq: Once | ORAL | Status: AC
Start: 2024-01-23 — End: 2024-01-23
  Administered 2024-01-23: 650 mg via ORAL
  Filled 2024-01-23: qty 2

## 2024-01-23 MED ORDER — IBUPROFEN 400 MG PO TABS
600.0000 mg | ORAL_TABLET | Freq: Once | ORAL | Status: AC
  Administered 2024-01-23: 600 mg via ORAL
  Filled 2024-01-23: qty 1

## 2024-01-23 NOTE — ED Notes (Signed)
 ED Provider at bedside.

## 2024-01-23 NOTE — ED Provider Notes (Signed)
 [facility] Provider Note   CSN: [id number] Arrival date & time: 01/23/24  2016     Patient presents with: [place]   Kristen Heath is a 58 y.o. female past medical 3 significant for diabetes, migraines, carpal tunnel, and vertigo presents today after a fall yesterday.  Patient reports that she had a vertigo-like episode where she felt dizzy and fell.  Patient denies head injury or LOC.  Patient reports left ankle, hand, elbow, and forearm pain.  Patient denies blood thinner use.  Patient denies any dizziness at this time, chest pain, shortness of breath, diplopia, tinnitus, or any other complaints at this time.    Fall       Prior to Admission medications   Medication Sig Start Date End Date Taking? Authorizing Provider  albuterol  (ACCUNEB ) 0.63 MG/3ML nebulizer solution Take 3 mLs (0.63 mg total) by nebulization every 6 (six) hours as needed for wheezing. 07/12/22   Kassie Acquanetta Bradley, MD  albuterol  (PROVENTIL  HFA;VENTOLIN  HFA) 108 (90 BASE) MCG/ACT inhaler Inhale 2 puffs into the lungs every 6 (six) hours as needed for wheezing or shortness of breath.     [provider]  amoxicillin -clavulanate (AUGMENTIN ) 875-125 MG tablet Take 1 tablet by mouth every 12 (twelve) hours. Patient not taking: Reported on 05/02/2023 03/28/23   Silver Fell A, PA  ARIPiprazole (ABILIFY) 10 MG tablet Take 10 mg by mouth. 09/10/22   [provider]  atorvastatin (LIPITOR) 20 MG tablet Take 20 mg by mouth daily at 6 PM.    [provider]  azelastine (ASTELIN) 0.1 % nasal spray Place 1 spray into both nostrils. 05/17/21   [provider]  azithromycin  (ZITHROMAX ) 250 MG tablet Take 1 tablet (250 mg total) by mouth daily. Take first 2 tablets together, then 1 every day until finished. 03/28/23   Silver Fell LABOR, PA  benzonatate  (TESSALON ) 100 MG capsule Take 1 capsule (100 mg total) by mouth 3 (three) times daily as needed. 03/28/23    Silver Fell A, PA  betamethasone valerate ointment (VALISONE) 0.1 % Apply 1 Application topically daily. 04/02/22   [provider]  buPROPion (WELLBUTRIN XL) 150 MG 24 hr tablet Take 150 mg by mouth daily. 05/16/22   [provider]  Capsaicin 0.1 % CREA Apply topically.    [provider]  cholecalciferol (VITAMIN D) 1000 units tablet Take 2,000 Units by mouth daily.    [provider]  cyclobenzaprine  (FLEXERIL ) 10 MG tablet Take 1 tablet (10 mg total) by mouth 2 (two) times daily as needed for muscle spasms. 12/28/23   Doretha Folks, MD  diclofenac Sodium (VOLTAREN) 1 % GEL diclofenac 1 % topical gel    [provider]  ferrous sulfate 325 (65 FE) MG tablet Take 325 mg by mouth daily with breakfast.    [provider]  fluticasone  (FLONASE ) 50 MCG/ACT nasal spray Place 2 sprays into both nostrils daily. 03/28/14   Dansie, William, PA-C  gabapentin (NEURONTIN) 300 MG capsule Take 300 mg by mouth 3 (three) times daily. 11/08/22   [provider]  ipratropium (ATROVENT ) 0.06 % nasal spray Place 1 spray into the nose. 07/13/22   [provider]  lidocaine  (LIDODERM ) 5 % Place 1 patch onto the skin daily. 09/10/22   [provider]  lisinopril-hydrochlorothiazide (ZESTORETIC) 10-12.5 MG tablet Take 1 tablet by mouth daily. 12/17/17   [provider]  loratadine (CLARITIN) 10 MG tablet Take 10 mg by mouth daily. 07/03/22  [provider]  Meclizine HCl 25 MG CHEW one tablet (25 mg dose). 07/13/22   [provider]  Melatonin 3 MG CAPS Take by mouth. 10/31/22   [provider]  meloxicam (MOBIC) 7.5 MG tablet Take 1 tablet (7.5 mg total) by mouth daily. 12/28/23   Doretha Folks, MD  methocarbamol  (ROBAXIN ) 500 MG tablet Take 1 tablet (500 mg total) by mouth 2 (two) times daily as needed for muscle spasms. 12/10/22   Mound, Darryle BRAVO, FNP  mometasone-formoterol (DULERA) 200-5 MCG/ACT AERO  Inhale 2 puffs into the lungs. 11/13/22   [provider]  montelukast (SINGULAIR) 10 MG tablet Take 10 mg by mouth at bedtime.    [provider]  omeprazole (PRILOSEC) 20 MG capsule Take 20 mg by mouth daily.    [provider]  ondansetron  (ZOFRAN -ODT) 4 MG disintegrating tablet Take 1 tablet (4 mg total) by mouth every 8 (eight) hours as needed for nausea or vomiting. 09/20/22   Banister, Pamela K, MD  polyethylene glycol (MIRALAX / GLYCOLAX) packet Take 17 g by mouth daily.    [provider]  QUEtiapine (SEROQUEL) 100 MG tablet Take by mouth. 02/08/23   [provider]  SYNJARDY XR 12.07-998 MG TB24 Take 1 tablet by mouth daily. 01/28/23   [provider]  triamcinolone  (KENALOG ) 0.025 % ointment Apply 1 Application topically 2 (two) times daily. 10/22/22   [provider]  zolpidem  (AMBIEN  CR) 6.25 MG CR tablet Take 6.25 mg by mouth at bedtime as needed. 04/02/22   [provider]    Allergies: Modafinil, Niacin and related, and Niacin    Review of Systems  Musculoskeletal:  Positive for arthralgias.  Neurological:  Positive for dizziness.    Updated Vital Signs BP 134/79   Pulse 85   Temp 98.2 F (36.8 C) (Oral)   Resp 18   Ht 5' 4 (1.626 m)   Wt 84.8 kg   LMP 01/22/2013   SpO2 100%   BMI 32.10 kg/m   Physical Exam Vitals and nursing note reviewed.  Constitutional:      General: She is not in acute distress.    Appearance: Normal appearance. She is well-developed. She is not toxic-appearing.  HENT:     Head: Normocephalic and atraumatic.     Right Ear: External ear normal.     Left Ear: External ear normal.  Eyes:     Conjunctiva/sclera: Conjunctivae normal.  Cardiovascular:     Rate and Rhythm: Normal rate and regular rhythm.     Pulses: Normal pulses.     Heart sounds: Normal heart sounds. No murmur heard. Pulmonary:     Effort: Pulmonary effort is normal. No respiratory distress.     Breath  sounds: Normal breath sounds.  Abdominal:     Palpations: Abdomen is soft.     Tenderness: There is no abdominal tenderness.  Musculoskeletal:        General: Tenderness present. No swelling or deformity.     Cervical back: Neck supple.     Comments: Patient reports tenderness to palpation of left medial and lateral malleoli without obvious swelling, deformity, or ecchymosis.  Patient also reports tenderness to palpation of the left elbow along the lateral epicondyle and down to the forearm, no deformity or ecchymosis noted on exam.  Patient also reports left distal phalanx of her 2nd, 3rd and 4th finger pain.  Patient denies snuffbox tenderness.  Patient neurovascularly intact.  Skin:    General: Skin is warm  and dry.     Capillary Refill: Capillary refill takes less than 2 seconds.  Neurological:     Mental Status: She is alert.  Psychiatric:        Mood and Affect: Mood normal.     (all labs ordered are listed, but only abnormal results are displayed) Labs Reviewed  COMPREHENSIVE METABOLIC PANEL WITH GFR - Abnormal; Notable for the following components:      Result Value   Glucose, Bld 103 (*)    All other components within normal limits  URINALYSIS, ROUTINE W REFLEX MICROSCOPIC - Abnormal; Notable for the following components:   Color, Urine COLORLESS (*)    Glucose, UA >1,000 (*)    Hgb urine dipstick TRACE (*)    All other components within normal limits  CBC  CBG MONITORING, ED    EKG: None  Radiology: CT Head Wo Contrast Result Date: 01/23/2024 EXAM: CT HEAD WITHOUT CONTRAST 01/23/2024 09:55:00 PM TECHNIQUE: CT of the head was performed without the administration of intravenous contrast. Automated exposure control, iterative reconstruction, and/or weight based adjustment of the mA/kV was utilized to reduce the radiation dose to as low as reasonably achievable. COMPARISON: CT 01/26/2023 CLINICAL HISTORY: Headache, new onset (Age >= 51y) FINDINGS: BRAIN AND VENTRICLES: No  acute hemorrhage. No evidence of acute infarct. No hydrocephalus. No extra-axial collection. No mass effect or midline shift. Atherosclerotic calcifications are present within the cavernous internal carotid arteries. ORBITS: No acute abnormality. SINUSES: No acute abnormality. SOFT TISSUES AND SKULL: No acute soft tissue abnormality. No skull fracture. IMPRESSION: 1. No acute intracranial abnormality. Electronically signed by: Morgane Naveau MD 01/23/2024 09:59 PM EDT RP Workstation: [id number]   DG Forearm Left Result Date: 01/23/2024 EXAM: 2 VIEW(S) XRAY OF THE LEFT FOREARM 01/23/2024 09:16:00 PM COMPARISON: None available. CLINICAL HISTORY: Pain. Fall injury. FINDINGS: BONES AND JOINTS: No acute fracture. No focal osseous lesion. No joint dislocation. SOFT TISSUES: The soft tissues are unremarkable. IMPRESSION: 1. No significant abnormality. Electronically signed by: Francis Quam MD 01/23/2024 09:28 PM EDT RP Workstation: [id number]   DG Hand Complete Left Result Date: 01/23/2024 EXAM: 3 OR MORE VIEW(S) XRAY OF THE LEFT HAND 01/23/2024 09:16:00 PM COMPARISON: None available. CLINICAL HISTORY: pain fall FINDINGS: BONES AND JOINTS: No acute fracture. No joint dislocation. There is mild narrowing and spurring of the 1st CMC (carpometacarpal) joint and all 5 distal interphalangeal joints. There is no erosive arthropathy. Arthritic changes are not seen elsewhere. SOFT TISSUES: The soft tissues are unremarkable. IMPRESSION: 1. No acute osseous abnormality. 2. Mild degenerative changes. Electronically signed by: Francis Quam MD 01/23/2024 09:27 PM EDT RP Workstation: [id number]   DG Ankle Complete Left Result Date: 01/23/2024 EXAM: 3 OR MORE VIEW(S) XRAY OF THE LEFT ANKLE 01/23/2024 09:16:00 PM CLINICAL HISTORY: pain fall COMPARISON: Left ankle series of 12/28/2023. FINDINGS: BONES AND JOINTS: No acute fracture. No joint dislocation. Again noted is a small chronic avulsion fragment inferior to the medial  malleolus. Slight spurring noted of the medial tibiotalar joint. The mortise is symmetric. Noninflammatory dorsal calcaneal spurring change. SOFT TISSUES: There is mild generalized soft tissue swelling, similar to the prior study. IMPRESSION: 1. No acute osseous abnormality. 2. Soft tissue swelling without evidence of fractures. Stable exam. Electronically signed by: Francis Quam MD 01/23/2024 09:24 PM EDT RP Workstation: [id number]   DG Elbow Complete Left Result Date: 01/23/2024 EXAM: 3 VIEW(S) XRAY OF THE LEFT ELBOW COMPARISON: Left elbow series 12/28/2023. CLINICAL HISTORY: pain fall FINDINGS: BONES AND JOINTS: No acute fracture. No focal  osseous lesion. There is mild joint narrowing and spurring of the medial aspect of the trochleoulnar joint. Arthritic changes are not seen elsewhere. No joint dislocation. No joint effusion. SOFT TISSUES: The soft tissues are unremarkable. IMPRESSION: 1. 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 Evidence of fractures is not seen. Stable exam. Electronically signed by: Francis Quam MD 01/23/2024 09:21 PM EDT RP Workstation: [id number]     Procedures   Medications Ordered in the ED  acetaminophen  (TYLENOL ) tablet 650 mg (650 mg Oral Given 01/23/24 2204)  ibuprofen  (ADVIL ) tablet 600 mg (600 mg Oral Given 01/23/24 2204)                                    Medical Decision Making Amount and/or Complexity of Data Reviewed Labs: ordered. Radiology: ordered.  Risk OTC drugs.   This patient presents to the ED for concern of fall differential diagnosis includes vertigo, arrhythmia, brain bleed, musculoskeletal pain, fracture, dislocation, electrolyte abnormality, anemia  Additional history obtained   Additional history obtained from Electronic Medical Record External records from outside source obtained and reviewed including Care Everywhere   Lab Tests:  I Ordered, and personally interpreted labs.  The pertinent results include:  CBC unremarkable, CMP unremarkable   Imaging Studies ordered:  I ordered imaging studies including CT head Noncon I independently visualized and interpreted imaging which showed no acute intracranial abnormality I agree with the radiologist interpretation Left ankle x-ray which showed soft tissue swelling without evidence of fracture. Left elbow x-ray which showed no evidence of fracture Left forearm x-ray which showed no significant abnormality Left hand x-ray which showed no acute osseous abnormality EKG which showed sinus rhythm   Medicines ordered and prescription drug management:  I ordered medication including Tylenol  and ibuprofen     I have reviewed the patients home medicines and have made adjustments as needed   Problem List / ED Course:  Patient placed in ASO Brace Considered for admission or further workup however patient's vital signs, physical exam, labs, and imaging are reassuring.  Patient's symptoms likely due to musculoskeletal pain.  Patient given ankle brace and advised to alternate Tylenol  Motrin  as needed for pain.  Patient given return precautions.  I feel patient safe for discharge at this time.     Final diagnoses:  Musculoskeletal pain    ED Discharge Orders     None          Rhiannan Kievit N, PA-C 01/23/24 [id number]    Tegeler, Lonni PARAS, MD 01/23/24 [phone]

## 2024-01-23 NOTE — Discharge Instructions (Signed)
 Today you were seen after a fall.  I suspect your symptoms are likely due to musculoskeletal pain.  You may alternate Tylenol  Motrin  as needed for pain.  Thank you for letting us  treat you today. After reviewing your labs and imaging, I feel you are safe to go home. Please follow up with your PCP in the next several days and provide them with your records from this visit. Return to the Emergency Room if pain becomes severe or symptoms worsen.

## 2024-01-23 NOTE — ED Notes (Signed)
 X-Ray at bedside.

## 2024-01-23 NOTE — ED Notes (Signed)
 Reviewed discharge instructions and follow-up care with pt. Pt verbalized understanding and had no further questions. Pt exited ED without complications.

## 2024-01-23 NOTE — ED Notes (Signed)
 Patient transported to CT

## 2024-01-23 NOTE — ED Triage Notes (Signed)
 Pt reports fall yesterday after feeling dizzy. Pt denies any head strike. Pt reports landing on L side. Pt reports L ankle pain, L hand pain, L elbow pain. Pt denies any blood thinners.
